# Patient Record
Sex: Male | Born: 1940 | Race: Black or African American | Hispanic: No
Health system: Southern US, Community
[De-identification: ages and names within clinical notes are randomized; demographics above are authoritative.]

## PROBLEM LIST (undated history)

## (undated) DIAGNOSIS — I10 Essential (primary) hypertension: Secondary | ICD-10-CM

---

## 1998-05-10 ENCOUNTER — Other Ambulatory Visit: Admission: RE | Admit: 1998-05-10 | Discharge: 1998-05-10 | Payer: Self-pay | Admitting: Urology

## 2004-01-01 ENCOUNTER — Ambulatory Visit (HOSPITAL_COMMUNITY): Admission: RE | Admit: 2004-01-01 | Discharge: 2004-01-01 | Payer: Self-pay | Admitting: Internal Medicine

## 2004-01-26 ENCOUNTER — Ambulatory Visit (HOSPITAL_COMMUNITY): Admission: RE | Admit: 2004-01-26 | Discharge: 2004-01-26 | Payer: Self-pay | Admitting: Internal Medicine

## 2004-11-24 ENCOUNTER — Ambulatory Visit: Payer: Self-pay | Admitting: Internal Medicine

## 2005-11-28 ENCOUNTER — Encounter (INDEPENDENT_AMBULATORY_CARE_PROVIDER_SITE_OTHER): Payer: Self-pay | Admitting: Specialist

## 2005-11-28 ENCOUNTER — Other Ambulatory Visit: Admission: RE | Admit: 2005-11-28 | Discharge: 2005-11-28 | Payer: Self-pay | Admitting: Urology

## 2007-05-01 ENCOUNTER — Ambulatory Visit (HOSPITAL_COMMUNITY): Admission: RE | Admit: 2007-05-01 | Discharge: 2007-05-01 | Payer: Self-pay | Admitting: Family Medicine

## 2007-05-03 ENCOUNTER — Ambulatory Visit (HOSPITAL_COMMUNITY): Admission: RE | Admit: 2007-05-03 | Discharge: 2007-05-03 | Payer: Self-pay | Admitting: Family Medicine

## 2007-06-07 ENCOUNTER — Ambulatory Visit (HOSPITAL_COMMUNITY): Admission: RE | Admit: 2007-06-07 | Discharge: 2007-06-07 | Payer: Self-pay | Admitting: Urology

## 2007-06-11 ENCOUNTER — Ambulatory Visit: Payer: Self-pay | Admitting: Thoracic Surgery

## 2007-07-29 ENCOUNTER — Inpatient Hospital Stay (HOSPITAL_COMMUNITY): Admission: RE | Admit: 2007-07-29 | Discharge: 2007-07-30 | Payer: Self-pay | Admitting: Surgery

## 2007-07-29 ENCOUNTER — Ambulatory Visit: Payer: Self-pay | Admitting: Thoracic Surgery

## 2007-07-29 ENCOUNTER — Encounter (INDEPENDENT_AMBULATORY_CARE_PROVIDER_SITE_OTHER): Payer: Self-pay | Admitting: Surgery

## 2008-01-29 ENCOUNTER — Ambulatory Visit: Payer: Self-pay | Admitting: Internal Medicine

## 2008-02-26 ENCOUNTER — Encounter: Payer: Self-pay | Admitting: Internal Medicine

## 2008-02-26 ENCOUNTER — Ambulatory Visit: Payer: Self-pay | Admitting: Internal Medicine

## 2008-02-26 ENCOUNTER — Ambulatory Visit (HOSPITAL_COMMUNITY): Admission: RE | Admit: 2008-02-26 | Discharge: 2008-02-26 | Payer: Self-pay | Admitting: Internal Medicine

## 2008-10-09 ENCOUNTER — Ambulatory Visit (HOSPITAL_COMMUNITY): Admission: RE | Admit: 2008-10-09 | Discharge: 2008-10-09 | Payer: Self-pay | Admitting: Family Medicine

## 2008-10-14 ENCOUNTER — Ambulatory Visit (HOSPITAL_COMMUNITY): Admission: RE | Admit: 2008-10-14 | Discharge: 2008-10-14 | Payer: Self-pay | Admitting: Family Medicine

## 2008-10-20 ENCOUNTER — Ambulatory Visit (HOSPITAL_COMMUNITY): Admission: RE | Admit: 2008-10-20 | Discharge: 2008-10-20 | Payer: Self-pay | Admitting: Family Medicine

## 2008-10-21 ENCOUNTER — Ambulatory Visit: Payer: Self-pay | Admitting: Thoracic Surgery

## 2008-10-27 ENCOUNTER — Ambulatory Visit: Admission: RE | Admit: 2008-10-27 | Discharge: 2008-10-27 | Payer: Self-pay | Admitting: Thoracic Surgery

## 2008-11-04 ENCOUNTER — Ambulatory Visit: Payer: Self-pay | Admitting: Cardiology

## 2008-11-05 ENCOUNTER — Ambulatory Visit: Payer: Self-pay | Admitting: Cardiology

## 2008-11-05 ENCOUNTER — Encounter: Payer: Self-pay | Admitting: Cardiology

## 2008-11-06 ENCOUNTER — Encounter: Payer: Self-pay | Admitting: Thoracic Surgery

## 2008-11-06 ENCOUNTER — Ambulatory Visit: Payer: Self-pay | Admitting: Thoracic Surgery

## 2008-11-06 ENCOUNTER — Ambulatory Visit: Payer: Self-pay | Admitting: Internal Medicine

## 2008-11-06 ENCOUNTER — Inpatient Hospital Stay (HOSPITAL_COMMUNITY): Admission: RE | Admit: 2008-11-06 | Discharge: 2008-11-16 | Payer: Self-pay | Admitting: Thoracic Surgery

## 2008-11-24 ENCOUNTER — Ambulatory Visit: Payer: Self-pay | Admitting: Thoracic Surgery

## 2008-11-24 ENCOUNTER — Encounter: Admission: RE | Admit: 2008-11-24 | Discharge: 2008-11-24 | Payer: Self-pay | Admitting: Thoracic Surgery

## 2008-11-25 ENCOUNTER — Ambulatory Visit: Payer: Self-pay | Admitting: Internal Medicine

## 2008-11-25 ENCOUNTER — Ambulatory Visit: Payer: Self-pay | Admitting: Thoracic Surgery

## 2008-11-25 ENCOUNTER — Encounter: Admission: RE | Admit: 2008-11-25 | Discharge: 2008-11-25 | Payer: Self-pay | Admitting: Thoracic Surgery

## 2008-11-26 ENCOUNTER — Ambulatory Visit: Payer: Self-pay | Admitting: Internal Medicine

## 2008-12-01 ENCOUNTER — Ambulatory Visit (HOSPITAL_COMMUNITY): Admission: RE | Admit: 2008-12-01 | Discharge: 2008-12-01 | Payer: Self-pay | Admitting: Internal Medicine

## 2008-12-01 LAB — CBC WITH DIFFERENTIAL/PLATELET
Basophils Absolute: 0 10*3/uL (ref 0.0–0.1)
EOS%: 2.3 % (ref 0.0–7.0)
Eosinophils Absolute: 0.1 10*3/uL (ref 0.0–0.5)
LYMPH%: 18.7 % (ref 14.0–49.0)
MCH: 29.6 pg (ref 27.2–33.4)
MCV: 88.9 fL (ref 79.3–98.0)
MONO%: 14.1 % — ABNORMAL HIGH (ref 0.0–14.0)
NEUT#: 3.9 10*3/uL (ref 1.5–6.5)
Platelets: 282 10*3/uL (ref 140–400)
RBC: 3.71 10*6/uL — ABNORMAL LOW (ref 4.20–5.82)
nRBC: 0 % (ref 0–0)

## 2008-12-01 LAB — COMPREHENSIVE METABOLIC PANEL
CO2: 30 mEq/L (ref 19–32)
Creatinine, Ser: 1.37 mg/dL (ref 0.40–1.50)
Glucose, Bld: 115 mg/dL — ABNORMAL HIGH (ref 70–99)
Total Bilirubin: 0.5 mg/dL (ref 0.3–1.2)

## 2008-12-10 LAB — CBC WITH DIFFERENTIAL/PLATELET
Basophils Absolute: 0 10*3/uL (ref 0.0–0.1)
Eosinophils Absolute: 0 10*3/uL (ref 0.0–0.5)
HCT: 34.4 % — ABNORMAL LOW (ref 38.4–49.9)
HGB: 11.7 g/dL — ABNORMAL LOW (ref 13.0–17.1)
MCH: 29.9 pg (ref 27.2–33.4)
MONO#: 1.2 10*3/uL — ABNORMAL HIGH (ref 0.1–0.9)
NEUT#: 15.9 10*3/uL — ABNORMAL HIGH (ref 1.5–6.5)
NEUT%: 88.5 % — ABNORMAL HIGH (ref 39.0–75.0)
WBC: 18 10*3/uL — ABNORMAL HIGH (ref 4.0–10.3)
lymph#: 0.9 10*3/uL (ref 0.9–3.3)

## 2008-12-10 LAB — COMPREHENSIVE METABOLIC PANEL
Albumin: 3.8 g/dL (ref 3.5–5.2)
BUN: 21 mg/dL (ref 6–23)
CO2: 26 mEq/L (ref 19–32)
Calcium: 9 mg/dL (ref 8.4–10.5)
Chloride: 103 mEq/L (ref 96–112)
Creatinine, Ser: 1.37 mg/dL (ref 0.40–1.50)
Potassium: 4.8 mEq/L (ref 3.5–5.3)

## 2008-12-17 LAB — COMPREHENSIVE METABOLIC PANEL
ALT: 11 U/L (ref 0–53)
AST: 10 U/L (ref 0–37)
CO2: 26 mEq/L (ref 19–32)
Chloride: 100 mEq/L (ref 96–112)
Creatinine, Ser: 1.58 mg/dL — ABNORMAL HIGH (ref 0.40–1.50)
Potassium: 4.8 mEq/L (ref 3.5–5.3)
Total Protein: 6.8 g/dL (ref 6.0–8.3)

## 2008-12-17 LAB — CBC WITH DIFFERENTIAL/PLATELET
BASO%: 0.3 % (ref 0.0–2.0)
Basophils Absolute: 0 10*3/uL (ref 0.0–0.1)
EOS%: 2.2 % (ref 0.0–7.0)
HCT: 36.7 % — ABNORMAL LOW (ref 38.4–49.9)
HGB: 12.2 g/dL — ABNORMAL LOW (ref 13.0–17.1)
MCHC: 33.1 g/dL (ref 32.0–36.0)
MONO#: 0.5 10*3/uL (ref 0.1–0.9)
NEUT%: 59.3 % (ref 39.0–75.0)
RDW: 13.6 % (ref 11.0–14.6)
WBC: 4.7 10*3/uL (ref 4.0–10.3)
lymph#: 1.3 10*3/uL (ref 0.9–3.3)

## 2008-12-23 ENCOUNTER — Encounter: Admission: RE | Admit: 2008-12-23 | Discharge: 2008-12-23 | Payer: Self-pay | Admitting: Thoracic Surgery

## 2008-12-23 ENCOUNTER — Ambulatory Visit: Payer: Self-pay | Admitting: Thoracic Surgery

## 2008-12-24 LAB — COMPREHENSIVE METABOLIC PANEL
AST: 12 U/L (ref 0–37)
Albumin: 3.9 g/dL (ref 3.5–5.2)
Alkaline Phosphatase: 73 U/L (ref 39–117)
BUN: 30 mg/dL — ABNORMAL HIGH (ref 6–23)
Potassium: 4.9 mEq/L (ref 3.5–5.3)
Sodium: 141 mEq/L (ref 135–145)
Total Bilirubin: 0.3 mg/dL (ref 0.3–1.2)

## 2008-12-24 LAB — CBC WITH DIFFERENTIAL/PLATELET
Basophils Absolute: 0 10*3/uL (ref 0.0–0.1)
Eosinophils Absolute: 0.1 10*3/uL (ref 0.0–0.5)
HGB: 10.4 g/dL — ABNORMAL LOW (ref 13.0–17.1)
LYMPH%: 22.9 % (ref 14.0–49.0)
MCV: 94.2 fL (ref 79.3–98.0)
MONO%: 12.7 % (ref 0.0–14.0)
NEUT#: 2.8 10*3/uL (ref 1.5–6.5)
Platelets: 169 10*3/uL (ref 140–400)

## 2008-12-29 ENCOUNTER — Ambulatory Visit: Payer: Self-pay | Admitting: Internal Medicine

## 2008-12-31 LAB — CBC WITH DIFFERENTIAL/PLATELET
Eosinophils Absolute: 0 10*3/uL (ref 0.0–0.5)
HCT: 31.2 % — ABNORMAL LOW (ref 38.4–49.9)
LYMPH%: 9.7 % — ABNORMAL LOW (ref 14.0–49.0)
MCHC: 34 g/dL (ref 32.0–36.0)
MCV: 88.4 fL (ref 79.3–98.0)
MONO#: 0.7 10*3/uL (ref 0.1–0.9)
MONO%: 12.3 % (ref 0.0–14.0)
NEUT#: 4.6 10*3/uL (ref 1.5–6.5)
NEUT%: 78 % — ABNORMAL HIGH (ref 39.0–75.0)
Platelets: 303 10*3/uL (ref 140–400)
RBC: 3.53 10*6/uL — ABNORMAL LOW (ref 4.20–5.82)
WBC: 5.9 10*3/uL (ref 4.0–10.3)
nRBC: 0 % (ref 0–0)

## 2008-12-31 LAB — COMPREHENSIVE METABOLIC PANEL
ALT: 15 U/L (ref 0–53)
Alkaline Phosphatase: 74 U/L (ref 39–117)
Creatinine, Ser: 1.39 mg/dL (ref 0.40–1.50)
Sodium: 137 mEq/L (ref 135–145)
Total Bilirubin: 0.3 mg/dL (ref 0.3–1.2)
Total Protein: 6.9 g/dL (ref 6.0–8.3)

## 2009-01-07 LAB — COMPREHENSIVE METABOLIC PANEL
ALT: 15 U/L (ref 0–53)
AST: 14 U/L (ref 0–37)
Alkaline Phosphatase: 71 U/L (ref 39–117)
BUN: 32 mg/dL — ABNORMAL HIGH (ref 6–23)
Calcium: 9 mg/dL (ref 8.4–10.5)
Chloride: 103 mEq/L (ref 96–112)
Creatinine, Ser: 1.57 mg/dL — ABNORMAL HIGH (ref 0.40–1.50)
Total Bilirubin: 0.6 mg/dL (ref 0.3–1.2)

## 2009-01-07 LAB — CBC WITH DIFFERENTIAL/PLATELET
BASO%: 0.4 % (ref 0.0–2.0)
Basophils Absolute: 0 10*3/uL (ref 0.0–0.1)
EOS%: 1.2 % (ref 0.0–7.0)
HCT: 32.5 % — ABNORMAL LOW (ref 38.4–49.9)
HGB: 10.9 g/dL — ABNORMAL LOW (ref 13.0–17.1)
LYMPH%: 44.8 % (ref 14.0–49.0)
MCH: 29.9 pg (ref 27.2–33.4)
MCHC: 33.5 g/dL (ref 32.0–36.0)
MCV: 89.3 fL (ref 79.3–98.0)
MONO%: 7.1 % (ref 0.0–14.0)
NEUT%: 46.5 % (ref 39.0–75.0)

## 2009-01-14 LAB — CBC WITH DIFFERENTIAL/PLATELET
Basophils Absolute: 0 10*3/uL (ref 0.0–0.1)
EOS%: 2 % (ref 0.0–7.0)
HCT: 28 % — ABNORMAL LOW (ref 38.4–49.9)
HGB: 9.3 g/dL — ABNORMAL LOW (ref 13.0–17.1)
LYMPH%: 31.2 % (ref 14.0–49.0)
MCH: 29.7 pg (ref 27.2–33.4)
NEUT%: 51.6 % (ref 39.0–75.0)
Platelets: 73 10*3/uL — ABNORMAL LOW (ref 140–400)
lymph#: 1.2 10*3/uL (ref 0.9–3.3)

## 2009-01-14 LAB — COMPREHENSIVE METABOLIC PANEL
AST: 12 U/L (ref 0–37)
BUN: 20 mg/dL (ref 6–23)
CO2: 24 mEq/L (ref 19–32)
Calcium: 8.3 mg/dL — ABNORMAL LOW (ref 8.4–10.5)
Chloride: 103 mEq/L (ref 96–112)
Creatinine, Ser: 1.35 mg/dL (ref 0.40–1.50)
Total Bilirubin: 0.3 mg/dL (ref 0.3–1.2)

## 2009-01-21 LAB — COMPREHENSIVE METABOLIC PANEL
AST: 14 U/L (ref 0–37)
Albumin: 4.2 g/dL (ref 3.5–5.2)
Alkaline Phosphatase: 75 U/L (ref 39–117)
BUN: 23 mg/dL (ref 6–23)
Calcium: 9.5 mg/dL (ref 8.4–10.5)
Chloride: 101 mEq/L (ref 96–112)
Potassium: 4.6 mEq/L (ref 3.5–5.3)
Sodium: 138 mEq/L (ref 135–145)
Total Protein: 6.9 g/dL (ref 6.0–8.3)

## 2009-01-21 LAB — CBC WITH DIFFERENTIAL/PLATELET
Basophils Absolute: 0 10*3/uL (ref 0.0–0.1)
EOS%: 0 % (ref 0.0–7.0)
Eosinophils Absolute: 0 10*3/uL (ref 0.0–0.5)
HGB: 9.1 g/dL — ABNORMAL LOW (ref 13.0–17.1)
MCH: 29.8 pg (ref 27.2–33.4)
MCV: 88.5 fL (ref 79.3–98.0)
MONO%: 18.6 % — ABNORMAL HIGH (ref 0.0–14.0)
NEUT#: 3.6 10*3/uL (ref 1.5–6.5)
RBC: 3.05 10*6/uL — ABNORMAL LOW (ref 4.20–5.82)
RDW: 14.5 % (ref 11.0–14.6)
lymph#: 1.2 10*3/uL (ref 0.9–3.3)

## 2009-01-28 ENCOUNTER — Ambulatory Visit: Payer: Self-pay | Admitting: Internal Medicine

## 2009-01-28 LAB — CBC WITH DIFFERENTIAL/PLATELET
BASO%: 0.4 % (ref 0.0–2.0)
EOS%: 0.8 % (ref 0.0–7.0)
MCH: 29.9 pg (ref 27.2–33.4)
MCHC: 33.3 g/dL (ref 32.0–36.0)
MCV: 89.8 fL (ref 79.3–98.0)
MONO%: 6.7 % (ref 0.0–14.0)
RBC: 3.34 10*6/uL — ABNORMAL LOW (ref 4.20–5.82)
RDW: 14.6 % (ref 11.0–14.6)
lymph#: 1.4 10*3/uL (ref 0.9–3.3)
nRBC: 0 % (ref 0–0)

## 2009-01-28 LAB — COMPREHENSIVE METABOLIC PANEL
AST: 14 U/L (ref 0–37)
Alkaline Phosphatase: 65 U/L (ref 39–117)
Glucose, Bld: 107 mg/dL — ABNORMAL HIGH (ref 70–99)
Potassium: 4.5 mEq/L (ref 3.5–5.3)
Sodium: 138 mEq/L (ref 135–145)
Total Bilirubin: 0.5 mg/dL (ref 0.3–1.2)
Total Protein: 6.4 g/dL (ref 6.0–8.3)

## 2009-02-03 ENCOUNTER — Ambulatory Visit: Payer: Self-pay | Admitting: Thoracic Surgery

## 2009-02-03 ENCOUNTER — Encounter: Admission: RE | Admit: 2009-02-03 | Discharge: 2009-02-03 | Payer: Self-pay | Admitting: Thoracic Surgery

## 2009-02-04 LAB — CBC WITH DIFFERENTIAL/PLATELET
Eosinophils Absolute: 0 10*3/uL (ref 0.0–0.5)
LYMPH%: 36.3 % (ref 14.0–49.0)
MCH: 30 pg (ref 27.2–33.4)
MCHC: 33.1 g/dL (ref 32.0–36.0)
MCV: 90.6 fL (ref 79.3–98.0)
MONO%: 18.2 % — ABNORMAL HIGH (ref 0.0–14.0)
NEUT#: 1.8 10*3/uL (ref 1.5–6.5)
Platelets: 78 10*3/uL — ABNORMAL LOW (ref 140–400)
RBC: 2.87 10*6/uL — ABNORMAL LOW (ref 4.20–5.82)

## 2009-02-04 LAB — COMPREHENSIVE METABOLIC PANEL
Alkaline Phosphatase: 68 U/L (ref 39–117)
BUN: 21 mg/dL (ref 6–23)
Glucose, Bld: 83 mg/dL (ref 70–99)
Sodium: 140 mEq/L (ref 135–145)
Total Bilirubin: 0.4 mg/dL (ref 0.3–1.2)
Total Protein: 6.4 g/dL (ref 6.0–8.3)

## 2009-02-08 ENCOUNTER — Emergency Department (HOSPITAL_COMMUNITY): Admission: EM | Admit: 2009-02-08 | Discharge: 2009-02-08 | Payer: Self-pay | Admitting: Emergency Medicine

## 2009-02-11 LAB — CBC WITH DIFFERENTIAL/PLATELET
Basophils Absolute: 0 10*3/uL (ref 0.0–0.1)
Eosinophils Absolute: 0 10*3/uL (ref 0.0–0.5)
HGB: 8.6 g/dL — ABNORMAL LOW (ref 13.0–17.1)
LYMPH%: 11.1 % — ABNORMAL LOW (ref 14.0–49.0)
MCV: 90.6 fL (ref 79.3–98.0)
MONO%: 29.6 % — ABNORMAL HIGH (ref 0.0–14.0)
NEUT#: 4 10*3/uL (ref 1.5–6.5)
Platelets: 203 10*3/uL (ref 140–400)

## 2009-02-11 LAB — COMPREHENSIVE METABOLIC PANEL
CO2: 24 mEq/L (ref 19–32)
Creatinine, Ser: 1.34 mg/dL (ref 0.40–1.50)
Glucose, Bld: 127 mg/dL — ABNORMAL HIGH (ref 70–99)
Total Bilirubin: 0.4 mg/dL (ref 0.3–1.2)

## 2009-02-18 LAB — CBC WITH DIFFERENTIAL/PLATELET
Eosinophils Absolute: 0 10*3/uL (ref 0.0–0.5)
HCT: 27.1 % — ABNORMAL LOW (ref 38.4–49.9)
LYMPH%: 47.3 % (ref 14.0–49.0)
MONO#: 0.2 10*3/uL (ref 0.1–0.9)
NEUT#: 1.3 10*3/uL — ABNORMAL LOW (ref 1.5–6.5)
NEUT%: 42.9 % (ref 39.0–75.0)
Platelets: 249 10*3/uL (ref 140–400)
RBC: 2.87 10*6/uL — ABNORMAL LOW (ref 4.20–5.82)
WBC: 3 10*3/uL — ABNORMAL LOW (ref 4.0–10.3)

## 2009-02-18 LAB — COMPREHENSIVE METABOLIC PANEL
CO2: 27 mEq/L (ref 19–32)
Calcium: 9 mg/dL (ref 8.4–10.5)
Chloride: 102 mEq/L (ref 96–112)
Glucose, Bld: 111 mg/dL — ABNORMAL HIGH (ref 70–99)
Sodium: 138 mEq/L (ref 135–145)
Total Bilirubin: 0.6 mg/dL (ref 0.3–1.2)
Total Protein: 6.7 g/dL (ref 6.0–8.3)

## 2009-02-25 LAB — COMPREHENSIVE METABOLIC PANEL
ALT: 9 U/L (ref 0–53)
Albumin: 3.8 g/dL (ref 3.5–5.2)
CO2: 26 mEq/L (ref 19–32)
Chloride: 106 mEq/L (ref 96–112)
Glucose, Bld: 90 mg/dL (ref 70–99)
Potassium: 4.5 mEq/L (ref 3.5–5.3)
Sodium: 142 mEq/L (ref 135–145)
Total Protein: 6.3 g/dL (ref 6.0–8.3)

## 2009-02-25 LAB — CBC WITH DIFFERENTIAL/PLATELET
BASO%: 0.4 % (ref 0.0–2.0)
Eosinophils Absolute: 0.1 10*3/uL (ref 0.0–0.5)
MONO#: 0.7 10*3/uL (ref 0.1–0.9)
NEUT#: 1.4 10*3/uL — ABNORMAL LOW (ref 1.5–6.5)
Platelets: 80 10*3/uL — ABNORMAL LOW (ref 140–400)
RBC: 2.28 10*6/uL — ABNORMAL LOW (ref 4.20–5.82)
RDW: 19.6 % — ABNORMAL HIGH (ref 11.0–14.6)
WBC: 4 10*3/uL (ref 4.0–10.3)
lymph#: 1.7 10*3/uL (ref 0.9–3.3)

## 2009-02-26 ENCOUNTER — Encounter (HOSPITAL_COMMUNITY): Admission: RE | Admit: 2009-02-26 | Discharge: 2009-04-29 | Payer: Self-pay | Admitting: Internal Medicine

## 2009-02-26 LAB — TYPE & CROSSMATCH - CHCC

## 2009-03-01 ENCOUNTER — Ambulatory Visit (HOSPITAL_COMMUNITY): Admission: RE | Admit: 2009-03-01 | Discharge: 2009-03-01 | Payer: Self-pay | Admitting: Internal Medicine

## 2009-03-01 ENCOUNTER — Ambulatory Visit: Payer: Self-pay | Admitting: Internal Medicine

## 2009-03-03 LAB — COMPREHENSIVE METABOLIC PANEL
ALT: 14 U/L (ref 0–53)
AST: 17 U/L (ref 0–37)
Alkaline Phosphatase: 70 U/L (ref 39–117)
Sodium: 138 mEq/L (ref 135–145)
Total Bilirubin: 0.4 mg/dL (ref 0.3–1.2)
Total Protein: 6.6 g/dL (ref 6.0–8.3)

## 2009-03-03 LAB — CBC WITH DIFFERENTIAL/PLATELET
BASO%: 0 % (ref 0.0–2.0)
EOS%: 2.7 % (ref 0.0–7.0)
MCH: 30.7 pg (ref 27.2–33.4)
MCV: 93.3 fL (ref 79.3–98.0)
MONO%: 20.3 % — ABNORMAL HIGH (ref 0.0–14.0)
RBC: 3.29 10*6/uL — ABNORMAL LOW (ref 4.20–5.82)
RDW: 17.7 % — ABNORMAL HIGH (ref 11.0–14.6)
lymph#: 1.8 10*3/uL (ref 0.9–3.3)

## 2009-03-03 LAB — TECHNOLOGIST REVIEW

## 2009-05-05 ENCOUNTER — Ambulatory Visit: Payer: Self-pay | Admitting: Thoracic Surgery

## 2009-05-05 ENCOUNTER — Encounter: Admission: RE | Admit: 2009-05-05 | Discharge: 2009-05-05 | Payer: Self-pay | Admitting: Thoracic Surgery

## 2009-05-27 ENCOUNTER — Ambulatory Visit: Payer: Self-pay | Admitting: Internal Medicine

## 2009-05-31 ENCOUNTER — Ambulatory Visit (HOSPITAL_COMMUNITY): Admission: RE | Admit: 2009-05-31 | Discharge: 2009-05-31 | Payer: Self-pay | Admitting: Internal Medicine

## 2009-05-31 LAB — CBC WITH DIFFERENTIAL/PLATELET
Basophils Absolute: 0 10*3/uL (ref 0.0–0.1)
Eosinophils Absolute: 0.1 10*3/uL (ref 0.0–0.5)
HGB: 11.8 g/dL — ABNORMAL LOW (ref 13.0–17.1)
LYMPH%: 38.3 % (ref 14.0–49.0)
MCV: 93.6 fL (ref 79.3–98.0)
MONO#: 0.7 10*3/uL (ref 0.1–0.9)
MONO%: 13.5 % (ref 0.0–14.0)
NEUT#: 2.4 10*3/uL (ref 1.5–6.5)
Platelets: 212 10*3/uL (ref 140–400)
RDW: 12.7 % (ref 11.0–14.6)
WBC: 5.2 10*3/uL (ref 4.0–10.3)

## 2009-05-31 LAB — COMPREHENSIVE METABOLIC PANEL
ALT: 12 U/L (ref 0–53)
CO2: 29 mEq/L (ref 19–32)
Calcium: 8.9 mg/dL (ref 8.4–10.5)
Chloride: 104 mEq/L (ref 96–112)
Glucose, Bld: 96 mg/dL (ref 70–99)
Sodium: 138 mEq/L (ref 135–145)
Total Bilirubin: 0.5 mg/dL (ref 0.3–1.2)
Total Protein: 7 g/dL (ref 6.0–8.3)

## 2009-08-25 ENCOUNTER — Ambulatory Visit: Payer: Self-pay | Admitting: Internal Medicine

## 2009-08-26 ENCOUNTER — Ambulatory Visit (HOSPITAL_COMMUNITY): Admission: RE | Admit: 2009-08-26 | Discharge: 2009-08-26 | Payer: Self-pay | Admitting: Internal Medicine

## 2009-08-26 LAB — COMPREHENSIVE METABOLIC PANEL
Alkaline Phosphatase: 80 U/L (ref 39–117)
BUN: 27 mg/dL — ABNORMAL HIGH (ref 6–23)
CO2: 29 mEq/L (ref 19–32)
Creatinine, Ser: 1.87 mg/dL — ABNORMAL HIGH (ref 0.40–1.50)
Glucose, Bld: 97 mg/dL (ref 70–99)
Total Bilirubin: 0.6 mg/dL (ref 0.3–1.2)
Total Protein: 7 g/dL (ref 6.0–8.3)

## 2009-08-26 LAB — CBC WITH DIFFERENTIAL/PLATELET
Basophils Absolute: 0 10*3/uL (ref 0.0–0.1)
Eosinophils Absolute: 0.1 10*3/uL (ref 0.0–0.5)
HCT: 37.9 % — ABNORMAL LOW (ref 38.4–49.9)
HGB: 12.4 g/dL — ABNORMAL LOW (ref 13.0–17.1)
LYMPH%: 25.7 % (ref 14.0–49.0)
MCV: 92.4 fL (ref 79.3–98.0)
MONO#: 0.6 10*3/uL (ref 0.1–0.9)
MONO%: 12.6 % (ref 0.0–14.0)
NEUT#: 3.1 10*3/uL (ref 1.5–6.5)
NEUT%: 59.6 % (ref 39.0–75.0)
Platelets: 235 10*3/uL (ref 140–400)
RBC: 4.11 10*6/uL — ABNORMAL LOW (ref 4.20–5.82)
WBC: 5.1 10*3/uL (ref 4.0–10.3)

## 2009-10-06 ENCOUNTER — Ambulatory Visit (HOSPITAL_COMMUNITY): Admission: RE | Admit: 2009-10-06 | Discharge: 2009-10-06 | Payer: Self-pay | Admitting: Family Medicine

## 2009-10-08 ENCOUNTER — Ambulatory Visit: Admission: RE | Admit: 2009-10-08 | Discharge: 2009-11-08 | Payer: Self-pay | Admitting: Radiation Oncology

## 2009-10-11 ENCOUNTER — Ambulatory Visit: Payer: Self-pay | Admitting: Internal Medicine

## 2009-10-11 LAB — COMPREHENSIVE METABOLIC PANEL
ALT: 10 U/L (ref 0–53)
AST: 12 U/L (ref 0–37)
Albumin: 4.2 g/dL (ref 3.5–5.2)
Alkaline Phosphatase: 71 U/L (ref 39–117)
BUN: 28 mg/dL — ABNORMAL HIGH (ref 6–23)
Calcium: 9.6 mg/dL (ref 8.4–10.5)
Chloride: 103 mEq/L (ref 96–112)
Creatinine, Ser: 1.59 mg/dL — ABNORMAL HIGH (ref 0.40–1.50)
Potassium: 4.4 mEq/L (ref 3.5–5.3)

## 2009-10-11 LAB — CBC WITH DIFFERENTIAL/PLATELET
BASO%: 0.2 % (ref 0.0–2.0)
EOS%: 1.8 % (ref 0.0–7.0)
MCH: 29.7 pg (ref 27.2–33.4)
MCHC: 33.1 g/dL (ref 32.0–36.0)
MONO#: 0.6 10*3/uL (ref 0.1–0.9)
NEUT%: 53.1 % (ref 39.0–75.0)
RBC: 4.14 10*6/uL — ABNORMAL LOW (ref 4.20–5.82)
WBC: 5.1 10*3/uL (ref 4.0–10.3)
lymph#: 1.7 10*3/uL (ref 0.9–3.3)
nRBC: 0 % (ref 0–0)

## 2009-10-15 ENCOUNTER — Ambulatory Visit (HOSPITAL_COMMUNITY): Admission: RE | Admit: 2009-10-15 | Discharge: 2009-10-15 | Payer: Self-pay | Admitting: Radiation Oncology

## 2009-10-18 ENCOUNTER — Ambulatory Visit (HOSPITAL_COMMUNITY): Admission: RE | Admit: 2009-10-18 | Discharge: 2009-10-18 | Payer: Self-pay | Admitting: Radiation Oncology

## 2009-11-23 ENCOUNTER — Ambulatory Visit: Payer: Self-pay | Admitting: Internal Medicine

## 2009-11-25 ENCOUNTER — Ambulatory Visit (HOSPITAL_COMMUNITY): Admission: RE | Admit: 2009-11-25 | Discharge: 2009-11-25 | Payer: Self-pay | Admitting: Internal Medicine

## 2009-11-25 LAB — COMPREHENSIVE METABOLIC PANEL
ALT: 13 U/L (ref 0–53)
AST: 15 U/L (ref 0–37)
Albumin: 3.7 g/dL (ref 3.5–5.2)
CO2: 31 mEq/L (ref 19–32)
Calcium: 9.4 mg/dL (ref 8.4–10.5)
Chloride: 105 mEq/L (ref 96–112)
Creatinine, Ser: 1.45 mg/dL (ref 0.40–1.50)
Potassium: 4.2 mEq/L (ref 3.5–5.3)
Sodium: 140 mEq/L (ref 135–145)
Total Protein: 7 g/dL (ref 6.0–8.3)

## 2009-11-25 LAB — CBC WITH DIFFERENTIAL/PLATELET
BASO%: 0.4 % (ref 0.0–2.0)
EOS%: 2.1 % (ref 0.0–7.0)
HCT: 38.5 % (ref 38.4–49.9)
MCHC: 32.4 g/dL (ref 32.0–36.0)
MONO#: 0.6 10*3/uL (ref 0.1–0.9)
NEUT%: 62.3 % (ref 39.0–75.0)
RDW: 14 % (ref 11.0–14.6)
WBC: 4 10*3/uL (ref 4.0–10.3)
lymph#: 0.8 10*3/uL — ABNORMAL LOW (ref 0.9–3.3)

## 2009-12-08 LAB — CBC WITH DIFFERENTIAL/PLATELET
Basophils Absolute: 0 10*3/uL (ref 0.0–0.1)
EOS%: 0 % (ref 0.0–7.0)
HCT: 38.4 % (ref 38.4–49.9)
HGB: 12.9 g/dL — ABNORMAL LOW (ref 13.0–17.1)
MCH: 30 pg (ref 27.2–33.4)
MCV: 89.3 fL (ref 79.3–98.0)
MONO%: 2.6 % (ref 0.0–14.0)
NEUT%: 93.8 % — ABNORMAL HIGH (ref 39.0–75.0)
Platelets: 204 10*3/uL (ref 140–400)
lymph#: 0.5 10*3/uL — ABNORMAL LOW (ref 0.9–3.3)

## 2009-12-08 LAB — COMPREHENSIVE METABOLIC PANEL
AST: 14 U/L (ref 0–37)
BUN: 25 mg/dL — ABNORMAL HIGH (ref 6–23)
Calcium: 10.2 mg/dL (ref 8.4–10.5)
Chloride: 100 mEq/L (ref 96–112)
Creatinine, Ser: 1.4 mg/dL (ref 0.40–1.50)

## 2009-12-15 LAB — COMPREHENSIVE METABOLIC PANEL
BUN: 32 mg/dL — ABNORMAL HIGH (ref 6–23)
CO2: 29 mEq/L (ref 19–32)
Calcium: 8.9 mg/dL (ref 8.4–10.5)
Chloride: 101 mEq/L (ref 96–112)
Creatinine, Ser: 1.46 mg/dL (ref 0.40–1.50)
Glucose, Bld: 144 mg/dL — ABNORMAL HIGH (ref 70–99)

## 2009-12-15 LAB — CBC WITH DIFFERENTIAL/PLATELET
Basophils Absolute: 0 10*3/uL (ref 0.0–0.1)
Eosinophils Absolute: 0 10*3/uL (ref 0.0–0.5)
HCT: 37 % — ABNORMAL LOW (ref 38.4–49.9)
HGB: 12.4 g/dL — ABNORMAL LOW (ref 13.0–17.1)
MCH: 31.1 pg (ref 27.2–33.4)
MONO#: 0.3 10*3/uL (ref 0.1–0.9)
NEUT%: 88 % — ABNORMAL HIGH (ref 39.0–75.0)
lymph#: 0.7 10*3/uL — ABNORMAL LOW (ref 0.9–3.3)

## 2009-12-22 LAB — CBC WITH DIFFERENTIAL/PLATELET
BASO%: 0 % (ref 0.0–2.0)
Basophils Absolute: 0 10*3/uL (ref 0.0–0.1)
HCT: 37.8 % — ABNORMAL LOW (ref 38.4–49.9)
HGB: 12.1 g/dL — ABNORMAL LOW (ref 13.0–17.1)
MONO#: 0.7 10*3/uL (ref 0.1–0.9)
NEUT%: 89.6 % — ABNORMAL HIGH (ref 39.0–75.0)
WBC: 15.8 10*3/uL — ABNORMAL HIGH (ref 4.0–10.3)
lymph#: 0.9 10*3/uL (ref 0.9–3.3)

## 2009-12-22 LAB — COMPREHENSIVE METABOLIC PANEL
ALT: 9 U/L (ref 0–53)
Albumin: 3.9 g/dL (ref 3.5–5.2)
BUN: 22 mg/dL (ref 6–23)
CO2: 28 mEq/L (ref 19–32)
Calcium: 9 mg/dL (ref 8.4–10.5)
Chloride: 103 mEq/L (ref 96–112)
Creatinine, Ser: 1.36 mg/dL (ref 0.40–1.50)
Potassium: 4.4 mEq/L (ref 3.5–5.3)

## 2009-12-28 ENCOUNTER — Ambulatory Visit: Payer: Self-pay | Admitting: Internal Medicine

## 2009-12-30 LAB — CBC WITH DIFFERENTIAL/PLATELET
BASO%: 0.1 % (ref 0.0–2.0)
Basophils Absolute: 0 10*3/uL (ref 0.0–0.1)
EOS%: 0 % (ref 0.0–7.0)
Eosinophils Absolute: 0 10*3/uL (ref 0.0–0.5)
HCT: 37.2 % — ABNORMAL LOW (ref 38.4–49.9)
HGB: 12.1 g/dL — ABNORMAL LOW (ref 13.0–17.1)
MCH: 29.5 pg (ref 27.2–33.4)
MCV: 90.7 fL (ref 79.3–98.0)
NEUT#: 19.3 10*3/uL — ABNORMAL HIGH (ref 1.5–6.5)
Platelets: 378 10*3/uL (ref 140–400)
RDW: 14 % (ref 11.0–14.6)
nRBC: 0 % (ref 0–0)

## 2009-12-30 LAB — COMPREHENSIVE METABOLIC PANEL
AST: 13 U/L (ref 0–37)
Alkaline Phosphatase: 80 U/L (ref 39–117)
BUN: 23 mg/dL (ref 6–23)
Glucose, Bld: 132 mg/dL — ABNORMAL HIGH (ref 70–99)
Total Bilirubin: 0.3 mg/dL (ref 0.3–1.2)

## 2010-01-06 LAB — COMPREHENSIVE METABOLIC PANEL
ALT: 8 U/L (ref 0–53)
AST: 9 U/L (ref 0–37)
Albumin: 3.7 g/dL (ref 3.5–5.2)
Alkaline Phosphatase: 79 U/L (ref 39–117)
BUN: 24 mg/dL — ABNORMAL HIGH (ref 6–23)
Calcium: 9.2 mg/dL (ref 8.4–10.5)
Chloride: 102 mEq/L (ref 96–112)
Potassium: 4.3 mEq/L (ref 3.5–5.3)
Sodium: 141 mEq/L (ref 135–145)
Total Protein: 5.7 g/dL — ABNORMAL LOW (ref 6.0–8.3)

## 2010-01-06 LAB — CBC WITH DIFFERENTIAL/PLATELET
BASO%: 0.1 % (ref 0.0–2.0)
Basophils Absolute: 0 10*3/uL (ref 0.0–0.1)
EOS%: 0.3 % (ref 0.0–7.0)
HGB: 11.5 g/dL — ABNORMAL LOW (ref 13.0–17.1)
MCH: 30.7 pg (ref 27.2–33.4)
MCHC: 33.3 g/dL (ref 32.0–36.0)
MCV: 92.1 fL (ref 79.3–98.0)
MONO%: 4.8 % (ref 0.0–14.0)
RBC: 3.74 10*6/uL — ABNORMAL LOW (ref 4.20–5.82)
RDW: 14.4 % (ref 11.0–14.6)
lymph#: 1.1 10*3/uL (ref 0.9–3.3)

## 2010-01-13 LAB — CBC WITH DIFFERENTIAL/PLATELET
BASO%: 0 % (ref 0.0–2.0)
Basophils Absolute: 0 10*3/uL (ref 0.0–0.1)
EOS%: 0.4 % (ref 0.0–7.0)
HGB: 11.2 g/dL — ABNORMAL LOW (ref 13.0–17.1)
MCH: 29.5 pg (ref 27.2–33.4)
MCHC: 32 g/dL (ref 32.0–36.0)
MCV: 92.2 fL (ref 79.3–98.0)
MONO%: 6.9 % (ref 0.0–14.0)
NEUT%: 85.5 % — ABNORMAL HIGH (ref 39.0–75.0)
RDW: 15.1 % — ABNORMAL HIGH (ref 11.0–14.6)
lymph#: 0.9 10*3/uL (ref 0.9–3.3)

## 2010-01-13 LAB — COMPREHENSIVE METABOLIC PANEL
ALT: 8 U/L (ref 0–53)
AST: 11 U/L (ref 0–37)
Alkaline Phosphatase: 82 U/L (ref 39–117)
BUN: 21 mg/dL (ref 6–23)
Creatinine, Ser: 1.46 mg/dL (ref 0.40–1.50)
Potassium: 4.6 mEq/L (ref 3.5–5.3)

## 2010-01-19 LAB — CBC WITH DIFFERENTIAL/PLATELET
Basophils Absolute: 0 10*3/uL (ref 0.0–0.1)
EOS%: 0 % (ref 0.0–7.0)
HCT: 33.1 % — ABNORMAL LOW (ref 38.4–49.9)
HGB: 10.9 g/dL — ABNORMAL LOW (ref 13.0–17.1)
LYMPH%: 2.5 % — ABNORMAL LOW (ref 14.0–49.0)
MCH: 29.5 pg (ref 27.2–33.4)
MCHC: 32.9 g/dL (ref 32.0–36.0)
MCV: 89.5 fL (ref 79.3–98.0)
MONO%: 8.4 % (ref 0.0–14.0)
NEUT%: 89.1 % — ABNORMAL HIGH (ref 39.0–75.0)

## 2010-01-19 LAB — COMPREHENSIVE METABOLIC PANEL
AST: 11 U/L (ref 0–37)
Alkaline Phosphatase: 76 U/L (ref 39–117)
BUN: 26 mg/dL — ABNORMAL HIGH (ref 6–23)
Calcium: 10 mg/dL (ref 8.4–10.5)
Creatinine, Ser: 1.33 mg/dL (ref 0.40–1.50)
Total Bilirubin: 0.4 mg/dL (ref 0.3–1.2)

## 2010-01-26 LAB — COMPREHENSIVE METABOLIC PANEL
AST: 13 U/L (ref 0–37)
Alkaline Phosphatase: 95 U/L (ref 39–117)
BUN: 25 mg/dL — ABNORMAL HIGH (ref 6–23)
Creatinine, Ser: 1.44 mg/dL (ref 0.40–1.50)
Total Bilirubin: 0.3 mg/dL (ref 0.3–1.2)

## 2010-01-26 LAB — CBC WITH DIFFERENTIAL/PLATELET
Basophils Absolute: 0 10*3/uL (ref 0.0–0.1)
EOS%: 0.3 % (ref 0.0–7.0)
HCT: 33 % — ABNORMAL LOW (ref 38.4–49.9)
HGB: 11.1 g/dL — ABNORMAL LOW (ref 13.0–17.1)
LYMPH%: 6 % — ABNORMAL LOW (ref 14.0–49.0)
MCH: 31 pg (ref 27.2–33.4)
MCV: 92.6 fL (ref 79.3–98.0)
NEUT%: 82.3 % — ABNORMAL HIGH (ref 39.0–75.0)
Platelets: 231 10*3/uL (ref 140–400)
lymph#: 0.8 10*3/uL — ABNORMAL LOW (ref 0.9–3.3)

## 2010-01-31 ENCOUNTER — Ambulatory Visit: Payer: Self-pay | Admitting: Internal Medicine

## 2010-02-02 ENCOUNTER — Ambulatory Visit (HOSPITAL_COMMUNITY): Admission: RE | Admit: 2010-02-02 | Discharge: 2010-02-02 | Payer: Self-pay | Admitting: Internal Medicine

## 2010-02-02 LAB — CBC WITH DIFFERENTIAL/PLATELET
BASO%: 0.3 % (ref 0.0–2.0)
Eosinophils Absolute: 0 10*3/uL (ref 0.0–0.5)
MCHC: 33.4 g/dL (ref 32.0–36.0)
MONO#: 0.7 10*3/uL (ref 0.1–0.9)
NEUT#: 12.4 10*3/uL — ABNORMAL HIGH (ref 1.5–6.5)
Platelets: 254 10*3/uL (ref 140–400)
RBC: 3.44 10*6/uL — ABNORMAL LOW (ref 4.20–5.82)
RDW: 16.1 % — ABNORMAL HIGH (ref 11.0–14.6)
WBC: 14.2 10*3/uL — ABNORMAL HIGH (ref 4.0–10.3)
lymph#: 1.1 10*3/uL (ref 0.9–3.3)

## 2010-02-02 LAB — COMPREHENSIVE METABOLIC PANEL
ALT: 11 U/L (ref 0–53)
Albumin: 3.5 g/dL (ref 3.5–5.2)
CO2: 31 mEq/L (ref 19–32)
Chloride: 102 mEq/L (ref 96–112)
Glucose, Bld: 80 mg/dL (ref 70–99)
Potassium: 4.5 mEq/L (ref 3.5–5.3)
Sodium: 140 mEq/L (ref 135–145)
Total Bilirubin: 0.7 mg/dL (ref 0.3–1.2)
Total Protein: 6.6 g/dL (ref 6.0–8.3)

## 2010-02-09 LAB — CBC WITH DIFFERENTIAL/PLATELET
BASO%: 0.1 % (ref 0.0–2.0)
Eosinophils Absolute: 0 10*3/uL (ref 0.0–0.5)
LYMPH%: 3 % — ABNORMAL LOW (ref 14.0–49.0)
MCHC: 32.8 g/dL (ref 32.0–36.0)
MONO#: 0.3 10*3/uL (ref 0.1–0.9)
MONO%: 1.7 % (ref 0.0–14.0)
NEUT#: 18.4 10*3/uL — ABNORMAL HIGH (ref 1.5–6.5)
Platelets: 305 10*3/uL (ref 140–400)
RBC: 3.45 10*6/uL — ABNORMAL LOW (ref 4.20–5.82)
RDW: 16.8 % — ABNORMAL HIGH (ref 11.0–14.6)
WBC: 19.4 10*3/uL — ABNORMAL HIGH (ref 4.0–10.3)

## 2010-02-09 LAB — COMPREHENSIVE METABOLIC PANEL
ALT: 9 U/L (ref 0–53)
Albumin: 4.2 g/dL (ref 3.5–5.2)
Alkaline Phosphatase: 76 U/L (ref 39–117)
CO2: 24 mEq/L (ref 19–32)
Glucose, Bld: 107 mg/dL — ABNORMAL HIGH (ref 70–99)
Potassium: 4.4 mEq/L (ref 3.5–5.3)
Sodium: 138 mEq/L (ref 135–145)
Total Bilirubin: 0.4 mg/dL (ref 0.3–1.2)
Total Protein: 6.9 g/dL (ref 6.0–8.3)

## 2010-03-02 ENCOUNTER — Other Ambulatory Visit: Payer: Self-pay | Admitting: Internal Medicine

## 2010-03-02 LAB — CBC WITH DIFFERENTIAL/PLATELET
Basophils Absolute: 0 10*3/uL (ref 0.0–0.1)
Eosinophils Absolute: 0.1 10*3/uL (ref 0.0–0.5)
HGB: 10 g/dL — ABNORMAL LOW (ref 13.0–17.1)
MONO#: 0.6 10*3/uL (ref 0.1–0.9)
NEUT#: 2.5 10*3/uL (ref 1.5–6.5)
RBC: 3.25 10*6/uL — ABNORMAL LOW (ref 4.20–5.82)
RDW: 16.5 % — ABNORMAL HIGH (ref 11.0–14.6)
WBC: 4.3 10*3/uL (ref 4.0–10.3)
lymph#: 1.1 10*3/uL (ref 0.9–3.3)
nRBC: 0 % (ref 0–0)

## 2010-03-02 LAB — COMPREHENSIVE METABOLIC PANEL
Albumin: 4.4 g/dL (ref 3.5–5.2)
Alkaline Phosphatase: 68 U/L (ref 39–117)
BUN: 17 mg/dL (ref 6–23)
CO2: 26 mEq/L (ref 19–32)
Glucose, Bld: 98 mg/dL (ref 70–99)
Potassium: 4.5 mEq/L (ref 3.5–5.3)
Sodium: 139 mEq/L (ref 135–145)
Total Bilirubin: 0.6 mg/dL (ref 0.3–1.2)
Total Protein: 6.7 g/dL (ref 6.0–8.3)

## 2010-03-09 ENCOUNTER — Other Ambulatory Visit: Payer: Self-pay | Admitting: Internal Medicine

## 2010-03-09 LAB — CBC WITH DIFFERENTIAL/PLATELET
Basophils Absolute: 0.1 10*3/uL (ref 0.0–0.1)
Eosinophils Absolute: 0 10*3/uL (ref 0.0–0.5)
HGB: 10.6 g/dL — ABNORMAL LOW (ref 13.0–17.1)
MCV: 95.1 fL (ref 79.3–98.0)
MONO#: 0.4 10*3/uL (ref 0.1–0.9)
NEUT#: 0.7 10*3/uL — ABNORMAL LOW (ref 1.5–6.5)
RBC: 3.46 10*6/uL — ABNORMAL LOW (ref 4.20–5.82)
RDW: 15.9 % — ABNORMAL HIGH (ref 11.0–14.6)
WBC: 2.2 10*3/uL — ABNORMAL LOW (ref 4.0–10.3)
lymph#: 1 10*3/uL (ref 0.9–3.3)
nRBC: 0 % (ref 0–0)

## 2010-03-23 ENCOUNTER — Other Ambulatory Visit: Payer: Self-pay | Admitting: Internal Medicine

## 2010-03-23 LAB — COMPREHENSIVE METABOLIC PANEL
ALT: 9 U/L (ref 0–53)
Albumin: 4.5 g/dL (ref 3.5–5.2)
CO2: 27 mEq/L (ref 19–32)
Calcium: 9.8 mg/dL (ref 8.4–10.5)
Chloride: 101 mEq/L (ref 96–112)
Glucose, Bld: 96 mg/dL (ref 70–99)
Potassium: 4.3 mEq/L (ref 3.5–5.3)
Sodium: 138 mEq/L (ref 135–145)
Total Bilirubin: 0.4 mg/dL (ref 0.3–1.2)
Total Protein: 6.7 g/dL (ref 6.0–8.3)

## 2010-03-23 LAB — CBC WITH DIFFERENTIAL/PLATELET
BASO%: 0.4 % (ref 0.0–2.0)
LYMPH%: 20.4 % (ref 14.0–49.0)
MCHC: 33 g/dL (ref 32.0–36.0)
MONO#: 0.6 10*3/uL (ref 0.1–0.9)
NEUT#: 3.3 10*3/uL (ref 1.5–6.5)
Platelets: 296 10*3/uL (ref 140–400)
RBC: 3.48 10*6/uL — ABNORMAL LOW (ref 4.20–5.82)
RDW: 15.1 % — ABNORMAL HIGH (ref 11.0–14.6)
WBC: 5 10*3/uL (ref 4.0–10.3)

## 2010-03-30 ENCOUNTER — Other Ambulatory Visit: Payer: Self-pay | Admitting: Internal Medicine

## 2010-03-30 LAB — COMPREHENSIVE METABOLIC PANEL
ALT: 8 U/L (ref 0–53)
AST: 16 U/L (ref 0–37)
Albumin: 4.1 g/dL (ref 3.5–5.2)
CO2: 25 mEq/L (ref 19–32)
Calcium: 9.3 mg/dL (ref 8.4–10.5)
Chloride: 101 mEq/L (ref 96–112)
Potassium: 4.5 mEq/L (ref 3.5–5.3)
Total Protein: 6.5 g/dL (ref 6.0–8.3)

## 2010-03-30 LAB — CBC WITH DIFFERENTIAL/PLATELET
BASO%: 0.4 % (ref 0.0–2.0)
Eosinophils Absolute: 0.1 10*3/uL (ref 0.0–0.5)
HCT: 33.1 % — ABNORMAL LOW (ref 38.4–49.9)
MCHC: 33.5 g/dL (ref 32.0–36.0)
MONO#: 0.3 10*3/uL (ref 0.1–0.9)
NEUT#: 1.1 10*3/uL — ABNORMAL LOW (ref 1.5–6.5)
RBC: 3.59 10*6/uL — ABNORMAL LOW (ref 4.20–5.82)
WBC: 2.5 10*3/uL — ABNORMAL LOW (ref 4.0–10.3)
lymph#: 0.9 10*3/uL (ref 0.9–3.3)
nRBC: 0 % (ref 0–0)

## 2010-04-11 ENCOUNTER — Ambulatory Visit: Payer: Self-pay | Admitting: Internal Medicine

## 2010-04-11 ENCOUNTER — Ambulatory Visit (HOSPITAL_COMMUNITY)
Admission: RE | Admit: 2010-04-11 | Discharge: 2010-04-11 | Payer: Self-pay | Source: Home / Self Care | Attending: Internal Medicine | Admitting: Internal Medicine

## 2010-04-13 LAB — CBC WITH DIFFERENTIAL/PLATELET
Basophils Absolute: 0 10*3/uL (ref 0.0–0.1)
Eosinophils Absolute: 0.1 10*3/uL (ref 0.0–0.5)
HGB: 10.4 g/dL — ABNORMAL LOW (ref 13.0–17.1)
LYMPH%: 22.7 % (ref 14.0–49.0)
MCV: 91.6 fL (ref 79.3–98.0)
MONO#: 0.9 10*3/uL (ref 0.1–0.9)
MONO%: 17.7 % — ABNORMAL HIGH (ref 0.0–14.0)
NEUT#: 3 10*3/uL (ref 1.5–6.5)
Platelets: 380 10*3/uL (ref 140–400)
RBC: 3.46 10*6/uL — ABNORMAL LOW (ref 4.20–5.82)
WBC: 5.2 10*3/uL (ref 4.0–10.3)

## 2010-05-05 ENCOUNTER — Ambulatory Visit: Payer: Self-pay | Admitting: Internal Medicine

## 2010-05-05 LAB — COMPREHENSIVE METABOLIC PANEL
ALT: 11 U/L (ref 0–53)
AST: 12 U/L (ref 0–37)
Albumin: 4.2 g/dL (ref 3.5–5.2)
Alkaline Phosphatase: 74 U/L (ref 39–117)
BUN: 20 mg/dL (ref 6–23)
CO2: 30 mEq/L (ref 19–32)
Calcium: 9.7 mg/dL (ref 8.4–10.5)
Chloride: 102 mEq/L (ref 96–112)
Creatinine, Ser: 1.28 mg/dL (ref 0.40–1.50)
Glucose, Bld: 120 mg/dL — ABNORMAL HIGH (ref 70–99)
Potassium: 4.1 mEq/L (ref 3.5–5.3)
Sodium: 141 mEq/L (ref 135–145)
Total Bilirubin: 0.6 mg/dL (ref 0.3–1.2)
Total Protein: 6.7 g/dL (ref 6.0–8.3)

## 2010-05-05 LAB — CBC WITH DIFFERENTIAL/PLATELET
BASO%: 0.2 % (ref 0.0–2.0)
Basophils Absolute: 0 10*3/uL (ref 0.0–0.1)
EOS%: 3.5 % (ref 0.0–7.0)
Eosinophils Absolute: 0.2 10*3/uL (ref 0.0–0.5)
HCT: 36.3 % — ABNORMAL LOW (ref 38.4–49.9)
HGB: 11.8 g/dL — ABNORMAL LOW (ref 13.0–17.1)
LYMPH%: 18.1 % (ref 14.0–49.0)
MCH: 29.9 pg (ref 27.2–33.4)
MCHC: 32.5 g/dL (ref 32.0–36.0)
MCV: 92.1 fL (ref 79.3–98.0)
MONO#: 0.7 10*3/uL (ref 0.1–0.9)
MONO%: 10.9 % (ref 0.0–14.0)
NEUT#: 4.2 10*3/uL (ref 1.5–6.5)
NEUT%: 67.3 % (ref 39.0–75.0)
Platelets: 173 10*3/uL (ref 140–400)
RBC: 3.94 10*6/uL — ABNORMAL LOW (ref 4.20–5.82)
RDW: 14.8 % — ABNORMAL HIGH (ref 11.0–14.6)
WBC: 6.3 10*3/uL (ref 4.0–10.3)
lymph#: 1.1 10*3/uL (ref 0.9–3.3)

## 2010-05-22 ENCOUNTER — Encounter: Payer: Self-pay | Admitting: Thoracic Surgery

## 2010-06-07 ENCOUNTER — Other Ambulatory Visit: Payer: Self-pay | Admitting: Internal Medicine

## 2010-06-07 ENCOUNTER — Encounter (HOSPITAL_BASED_OUTPATIENT_CLINIC_OR_DEPARTMENT_OTHER): Payer: Medicare Other | Admitting: Internal Medicine

## 2010-06-07 DIAGNOSIS — C3410 Malignant neoplasm of upper lobe, unspecified bronchus or lung: Secondary | ICD-10-CM

## 2010-06-07 DIAGNOSIS — C3490 Malignant neoplasm of unspecified part of unspecified bronchus or lung: Secondary | ICD-10-CM

## 2010-06-07 LAB — COMPREHENSIVE METABOLIC PANEL
AST: 24 U/L (ref 0–37)
Albumin: 3.9 g/dL (ref 3.5–5.2)
Alkaline Phosphatase: 88 U/L (ref 39–117)
BUN: 32 mg/dL — ABNORMAL HIGH (ref 6–23)
Calcium: 9.9 mg/dL (ref 8.4–10.5)
Chloride: 106 mEq/L (ref 96–112)
Potassium: 4.3 mEq/L (ref 3.5–5.3)
Sodium: 145 mEq/L (ref 135–145)
Total Protein: 7.4 g/dL (ref 6.0–8.3)

## 2010-06-07 LAB — CBC WITH DIFFERENTIAL/PLATELET
Basophils Absolute: 0 10*3/uL (ref 0.0–0.1)
EOS%: 1.4 % (ref 0.0–7.0)
Eosinophils Absolute: 0.2 10*3/uL (ref 0.0–0.5)
HGB: 13.1 g/dL (ref 13.0–17.1)
MCH: 30.2 pg (ref 27.2–33.4)
NEUT#: 10 10*3/uL — ABNORMAL HIGH (ref 1.5–6.5)
RBC: 4.35 10*6/uL (ref 4.20–5.82)
RDW: 14.6 % (ref 11.0–14.6)
lymph#: 0.6 10*3/uL — ABNORMAL LOW (ref 0.9–3.3)

## 2010-06-09 ENCOUNTER — Ambulatory Visit (HOSPITAL_COMMUNITY)
Admission: RE | Admit: 2010-06-09 | Discharge: 2010-06-09 | Disposition: A | Discharge status: Home / Self Care | Payer: Medicare Other | Source: Ambulatory Visit | Attending: Neurology | Admitting: Neurology

## 2010-06-09 ENCOUNTER — Ambulatory Visit (HOSPITAL_COMMUNITY)
Admission: RE | Admit: 2010-06-09 | Discharge: 2010-06-09 | Disposition: A | Discharge status: Home / Self Care | Payer: Medicare Other | Source: Ambulatory Visit | Attending: Physical Therapy | Admitting: Physical Therapy

## 2010-06-09 DIAGNOSIS — I69998 Other sequelae following unspecified cerebrovascular disease: Secondary | ICD-10-CM | POA: Insufficient documentation

## 2010-06-09 DIAGNOSIS — M6281 Muscle weakness (generalized): Secondary | ICD-10-CM | POA: Insufficient documentation

## 2010-06-09 DIAGNOSIS — R269 Unspecified abnormalities of gait and mobility: Secondary | ICD-10-CM | POA: Insufficient documentation

## 2010-06-09 DIAGNOSIS — Z5189 Encounter for other specified aftercare: Secondary | ICD-10-CM | POA: Insufficient documentation

## 2010-06-09 DIAGNOSIS — R482 Apraxia: Secondary | ICD-10-CM | POA: Insufficient documentation

## 2010-06-13 ENCOUNTER — Other Ambulatory Visit: Payer: Self-pay

## 2010-06-13 DIAGNOSIS — I639 Cerebral infarction, unspecified: Secondary | ICD-10-CM

## 2010-06-14 ENCOUNTER — Ambulatory Visit (HOSPITAL_COMMUNITY): Payer: 59 | Admitting: Speech Pathology

## 2010-06-14 ENCOUNTER — Ambulatory Visit (HOSPITAL_COMMUNITY)
Admission: RE | Admit: 2010-06-14 | Discharge: 2010-06-14 | Disposition: A | Discharge status: Home / Self Care | Payer: Medicare Other | Source: Ambulatory Visit | Attending: Internal Medicine | Admitting: Internal Medicine

## 2010-06-14 ENCOUNTER — Other Ambulatory Visit: Payer: Self-pay

## 2010-06-14 ENCOUNTER — Ambulatory Visit (HOSPITAL_COMMUNITY): Payer: 59 | Admitting: Physical Therapy

## 2010-06-14 DIAGNOSIS — I639 Cerebral infarction, unspecified: Secondary | ICD-10-CM

## 2010-06-14 DIAGNOSIS — R1310 Dysphagia, unspecified: Secondary | ICD-10-CM | POA: Insufficient documentation

## 2010-06-16 ENCOUNTER — Ambulatory Visit (HOSPITAL_COMMUNITY): Payer: 59 | Admitting: Physical Therapy

## 2010-06-19 ENCOUNTER — Inpatient Hospital Stay: Admit: 2010-06-19 | Payer: Self-pay | Admitting: Neurology

## 2010-06-19 ENCOUNTER — Emergency Department (HOSPITAL_COMMUNITY): Payer: Medicare Other

## 2010-06-19 ENCOUNTER — Inpatient Hospital Stay (HOSPITAL_COMMUNITY): Payer: Medicare Other

## 2010-06-19 ENCOUNTER — Inpatient Hospital Stay (HOSPITAL_COMMUNITY)
Admission: RE | Admit: 2010-06-19 | Discharge: 2010-06-30 | DRG: 064 | Disposition: E | Payer: Medicare Other | Source: Other Acute Inpatient Hospital | Attending: Internal Medicine | Admitting: Internal Medicine

## 2010-06-19 ENCOUNTER — Emergency Department (HOSPITAL_COMMUNITY)
Admission: EM | Admit: 2010-06-19 | Discharge: 2010-06-19 | Disposition: A | Discharge status: Other Acute Inpatient Hospital | Payer: Medicare Other | Source: Home / Self Care | Attending: Emergency Medicine | Admitting: Emergency Medicine

## 2010-06-19 DIAGNOSIS — C7951 Secondary malignant neoplasm of bone: Secondary | ICD-10-CM | POA: Diagnosis present

## 2010-06-19 DIAGNOSIS — IMO0002 Reserved for concepts with insufficient information to code with codable children: Secondary | ICD-10-CM | POA: Diagnosis present

## 2010-06-19 DIAGNOSIS — I214 Non-ST elevation (NSTEMI) myocardial infarction: Secondary | ICD-10-CM | POA: Diagnosis present

## 2010-06-19 DIAGNOSIS — R791 Abnormal coagulation profile: Secondary | ICD-10-CM | POA: Diagnosis present

## 2010-06-19 DIAGNOSIS — Z87891 Personal history of nicotine dependence: Secondary | ICD-10-CM

## 2010-06-19 DIAGNOSIS — R29810 Facial weakness: Secondary | ICD-10-CM | POA: Diagnosis present

## 2010-06-19 DIAGNOSIS — J449 Chronic obstructive pulmonary disease, unspecified: Secondary | ICD-10-CM | POA: Diagnosis present

## 2010-06-19 DIAGNOSIS — R404 Transient alteration of awareness: Secondary | ICD-10-CM

## 2010-06-19 DIAGNOSIS — C7952 Secondary malignant neoplasm of bone marrow: Secondary | ICD-10-CM | POA: Diagnosis present

## 2010-06-19 DIAGNOSIS — D72829 Elevated white blood cell count, unspecified: Secondary | ICD-10-CM | POA: Diagnosis present

## 2010-06-19 DIAGNOSIS — C7931 Secondary malignant neoplasm of brain: Secondary | ICD-10-CM | POA: Diagnosis present

## 2010-06-19 DIAGNOSIS — D696 Thrombocytopenia, unspecified: Secondary | ICD-10-CM | POA: Insufficient documentation

## 2010-06-19 DIAGNOSIS — Z681 Body mass index (BMI) 19 or less, adult: Secondary | ICD-10-CM

## 2010-06-19 DIAGNOSIS — J4489 Other specified chronic obstructive pulmonary disease: Secondary | ICD-10-CM | POA: Diagnosis present

## 2010-06-19 DIAGNOSIS — R4182 Altered mental status, unspecified: Secondary | ICD-10-CM | POA: Insufficient documentation

## 2010-06-19 DIAGNOSIS — C3490 Malignant neoplasm of unspecified part of unspecified bronchus or lung: Secondary | ICD-10-CM | POA: Diagnosis present

## 2010-06-19 DIAGNOSIS — I6350 Cerebral infarction due to unspecified occlusion or stenosis of unspecified cerebral artery: Secondary | ICD-10-CM | POA: Insufficient documentation

## 2010-06-19 DIAGNOSIS — I10 Essential (primary) hypertension: Secondary | ICD-10-CM | POA: Diagnosis present

## 2010-06-19 DIAGNOSIS — E785 Hyperlipidemia, unspecified: Secondary | ICD-10-CM | POA: Diagnosis present

## 2010-06-19 DIAGNOSIS — E43 Unspecified severe protein-calorie malnutrition: Secondary | ICD-10-CM | POA: Diagnosis present

## 2010-06-19 DIAGNOSIS — E039 Hypothyroidism, unspecified: Secondary | ICD-10-CM | POA: Diagnosis present

## 2010-06-19 DIAGNOSIS — Z515 Encounter for palliative care: Secondary | ICD-10-CM

## 2010-06-19 DIAGNOSIS — N400 Enlarged prostate without lower urinary tract symptoms: Secondary | ICD-10-CM | POA: Diagnosis present

## 2010-06-19 DIAGNOSIS — G40909 Epilepsy, unspecified, not intractable, without status epilepticus: Secondary | ICD-10-CM | POA: Diagnosis present

## 2010-06-19 DIAGNOSIS — Z66 Do not resuscitate: Secondary | ICD-10-CM | POA: Diagnosis present

## 2010-06-19 HISTORY — DX: Essential (primary) hypertension: I10

## 2010-06-19 LAB — CBC
HCT: 34.7 % — ABNORMAL LOW (ref 39.0–52.0)
MCV: 88.7 fL (ref 78.0–100.0)
RBC: 3.91 MIL/uL — ABNORMAL LOW (ref 4.22–5.81)
WBC: 12.4 10*3/uL — ABNORMAL HIGH (ref 4.0–10.5)

## 2010-06-19 LAB — CK TOTAL AND CKMB (NOT AT ARMC)
CK, MB: 3.8 ng/mL (ref 0.3–4.0)
Relative Index: INVALID (ref 0.0–2.5)
Total CK: 87 U/L (ref 7–232)

## 2010-06-19 LAB — CARDIAC PANEL(CRET KIN+CKTOT+MB+TROPI)
CK, MB: 12.5 ng/mL (ref 0.3–4.0)
Relative Index: 10.5 — ABNORMAL HIGH (ref 0.0–2.5)
Total CK: 119 U/L (ref 7–232)

## 2010-06-19 LAB — DIFFERENTIAL
Lymphocytes Relative: 14 % (ref 12–46)
Lymphs Abs: 1.7 10*3/uL (ref 0.7–4.0)
Monocytes Relative: 7 % (ref 3–12)
Neutrophils Relative %: 77 % (ref 43–77)

## 2010-06-19 LAB — COMPREHENSIVE METABOLIC PANEL
ALT: 25 U/L (ref 0–53)
Albumin: 3.4 g/dL — ABNORMAL LOW (ref 3.5–5.2)
BUN: 26 mg/dL — ABNORMAL HIGH (ref 6–23)
Calcium: 9.9 mg/dL (ref 8.4–10.5)
Glucose, Bld: 114 mg/dL — ABNORMAL HIGH (ref 70–99)
Sodium: 143 mEq/L (ref 135–145)
Total Protein: 7 g/dL (ref 6.0–8.3)

## 2010-06-19 LAB — POCT I-STAT, CHEM 8
BUN: 27 mg/dL — ABNORMAL HIGH (ref 6–23)
Calcium, Ion: 1.2 mmol/L (ref 1.12–1.32)
Chloride: 107 mEq/L (ref 96–112)
Creatinine, Ser: 1.5 mg/dL (ref 0.4–1.5)
TCO2: 26 mmol/L (ref 0–100)

## 2010-06-19 LAB — TROPONIN I: Troponin I: 0.29 ng/mL — ABNORMAL HIGH (ref 0.00–0.06)

## 2010-06-19 LAB — PROTIME-INR: Prothrombin Time: 16.4 seconds — ABNORMAL HIGH (ref 11.6–15.2)

## 2010-06-20 ENCOUNTER — Inpatient Hospital Stay (HOSPITAL_COMMUNITY): Payer: Medicare Other

## 2010-06-20 ENCOUNTER — Encounter (HOSPITAL_COMMUNITY): Payer: Self-pay | Admitting: Radiology

## 2010-06-20 LAB — COMPREHENSIVE METABOLIC PANEL
Albumin: 2.8 g/dL — ABNORMAL LOW (ref 3.5–5.2)
BUN: 23 mg/dL (ref 6–23)
Creatinine, Ser: 1.43 mg/dL (ref 0.4–1.5)
GFR calc Af Amer: 59 mL/min — ABNORMAL LOW (ref >60)
Total Protein: 5.8 g/dL — ABNORMAL LOW (ref 6.0–8.3)

## 2010-06-20 LAB — CARDIAC PANEL(CRET KIN+CKTOT+MB+TROPI)
CK, MB: 15 ng/mL (ref 0.3–4.0)
CK, MB: 12.5 ng/mL (ref 0.3–4.0)
Relative Index: 10.1 — ABNORMAL HIGH (ref 0.0–2.5)
Total CK: 149 U/L (ref 7–232)
Total CK: 162 U/L (ref 7–232)
Troponin I: 2.15 ng/mL (ref 0.00–0.06)

## 2010-06-20 LAB — CBC
MCH: 28.5 pg (ref 26.0–34.0)
MCV: 87.5 fL (ref 78.0–100.0)
Platelets: 102 10*3/uL — ABNORMAL LOW (ref 150–400)
RDW: 14.6 % (ref 11.5–15.5)
WBC: 10.3 10*3/uL (ref 4.0–10.5)

## 2010-06-20 LAB — URINALYSIS, MICROSCOPIC ONLY
Bilirubin Urine: NEGATIVE
Ketones, ur: NEGATIVE mg/dL
Protein, ur: 30 mg/dL — AB
Specific Gravity, Urine: 1.015 (ref 1.005–1.030)
Urobilinogen, UA: 1 mg/dL (ref 0.0–1.0)

## 2010-06-20 LAB — GLUCOSE, CAPILLARY: Glucose-Capillary: 117 mg/dL — ABNORMAL HIGH (ref 70–99)

## 2010-06-20 LAB — LIPID PANEL
Cholesterol: 113 mg/dL (ref 0–200)
LDL Cholesterol: 65 mg/dL (ref 0–99)

## 2010-06-20 LAB — DIFFERENTIAL
Basophils Relative: 0 % (ref 0–1)
Eosinophils Absolute: 0.2 10*3/uL (ref 0.0–0.7)
Eosinophils Relative: 2 % (ref 0–5)
Lymphs Abs: 1.1 10*3/uL (ref 0.7–4.0)

## 2010-06-20 MED ORDER — GADOBENATE DIMEGLUMINE 529 MG/ML IV SOLN
14.0000 mL | Freq: Once | INTRAVENOUS | Status: AC
  Administered 2010-06-20: 14 mL via INTRAVENOUS

## 2010-06-20 NOTE — H&P (Signed)
NAMEFERDINANDO, [name]             ACCOUNT NO.:  [id number]  MEDICAL RECORD NO.:  [id number]           PATIENT TYPE:  I  LOCATION:  3311                         FACILITY:  [facility]  PHYSICIAN:  Jeoffrey Massed, MD    DATE OF BIRTH:  01/22/1941  DATE OF ADMISSION:  2010-07-18 DATE OF DISCHARGE:                             HISTORY & PHYSICAL   PRIMARY CARE PRACTITIONER:  Dr. Lilyan Punt.  PRIMARY ONCOLOGIST:  Dr. Si Gaul.  CHIEF COMPLAINT:  Altered mental status and questionable seizure-like activity that started at 10 a.m. this morning.  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  Apparently, his speech was clear.  He was able to walk without any assistance or without any aid and was doing rather well.  The wife stated that he did have some memory and behavioral issues after the surgery.  However, at around 10 o'clock this morning at home, the patient's spouse heard a noise.  The patient was also groaning or grunting.  She then observed him with his hands above his head and they were shaking.  She could not definitely state if it was seizure-like activity, but apparently his eyes were rolled back during this time. This lasted for a few minutes.  The wife is not sure if he had any post ictal state.  He was then brought to [facility] ED for further evaluation.  During this time, he has also had numerous episodes of vomiting and has had one vomiting episode here while at [facility].  In the [facility] ED, a CT of the head was done  which showed an acute/subacute left posterior temporal/parietal region infarct with a small amount of hemorrhage.  Diffuse atrophy was also seen.  During this time, the patient continued to be lethargic and was at times mumbling incoherently.  He was then transferred to [facility] for further evaluation and treatment.  During my evaluation, the patient is lethargic, mumbles incoherently at times but is easily arousable and recognizes his wife and some of his other family members who are in the room.  His speech is certainly not clear, but I do not know if this is yet dysarthric.  He is able to follow all my commands and has been able to lift and move all of his 4 extremities.  The wife denies any recent fever, cough, diarrhea, dysuria.  Patient also shakes head when actually is questioned.  ALLERGIES:  None.  PAST MEDICAL HISTORY: 1. 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 2. Recent likely ischemic infarct discovered during his gamma-knife     surgery in June 02, 2010, in [facility].  The patient was     not given TPA and was treated with aspirin and other conservative     measures. 3. COPD. 4. Hypothyroidism. 5. Hypertension. 6. Dyslipidemia.  PAST SURGICAL HISTORY: 1. The patient has had a lobectomy of most likely the left lung, the     wife is not sure. 2. He has also had a thyroidectomy for goiter.  FAMILY HISTORY:  There is no family history of stroke or malignancies in the family.  SOCIAL HISTORY:  The patient lives with his wife.  He used to drive a truck.  He quit smoking in July 2010.  He denies any other toxic habits.  REVIEW OF SYSTEMS:  A detailed review of 12 systems was done and these are negative except for the ones mentioned in the HPI.  PHYSICAL EXAM:   VITAL SIGNS:  His last blood pressure is 154/75; pulse ox 100% on 2 L nasal cannula, heart rate of 65, afebrile. GENERAL:  An elderly African American male lying in bed, tachypneic, but not in acute distress, lethargic, mumbling incoherently.  Easily arousable, following all commands.  Speech not clear, somewhat dysarthric but communicating reasonably well.  At times, he mumbles incoherently as well. HEENT: Atraumatic, normocephalic.  Pupils equal, reactive to light and accommodation.  Has left facial droop. Oral mucosa is moist. NECK:  Supple.  No JVD. CARDIOVASCULAR:  Heart sounds are regular.  No murmurs heard. LUNGS:  Bilaterally clear to auscultation. ABDOMEN:  Soft, nontender, nondistended. EXTREMITIES:  There is no edema. SKIN:  No rash. NEUROLOGIC:  Mental status is as above.  He appears to have adequate 5/5 strength in all of his 4 extremities.  Plantars are downgoing bilaterally and deep tendon reflexes are 2+ bilaterally as well.  LABORATORY DATA: 1. WBC is 12.4, hemoglobin is 11.3, hematocrit is 34.7, MCV 8.7,     platelet count of 118.  Neutrophils of 77%. 2. Chemistry shows sodium of 143, potassium of 4.3, chloride 106,bicarb of 27, glucose of 114, BUN of 26, creatinine 1.39. 3. LFTs show a total bilirubin 0.8, alkaline phosphatase of 103, AST     of 26, ALT of 25, total protein of 7, albumin of 3.4, calcium of     9.9. 4. Set of troponins are elevated at 0.29. 5. INR is 1.30, PTT is 25.  RADIOLOGICAL STUDIES: 1. X-ray of the chest shows no active lung disease.  Elevation of the     left hemidiaphragm may be chronic. 2. CT of the head without contrast showed acute/subacute left     posterior temporal/parietal infarct with perhaps a small amount of     hemorrhage.  Diffuse atrophy.  ASSESSMENT: 1. Sudden onset of altered mental status possibly secondary to new     acute/subacute cerebrovascular accident. 2. Cannot rule out underlying seizure-like activity as well  given the     patient's history and his history of brain mets. 3. Leukocytosis with no focal infection evident. 4. Uncontrolled hypertension. 5. Positive for a set of cardiac enzymes. 6. History of lung cancer with brain mets, status post gamma knife in     June 02, 2010, and also likely a questionable history of prior     cervical spine mets status post radiation. 7. Code blue/DNR.  PLAN: 1. The patient is already here in Unit  3300 and we will keep him here. 2. Given his CT findings, a small amount of hemorrhage, we will hold     off on starting his aspirin for now.  We will repeat imaging in the     morning.  If there is no progression of this hemorrhage, he will     then be started on aspirin.  This was also discussed with Dr.     Thad Ranger of Neurology who seemed to be in agreement with this. 3. We will start the patient empirically on Keppra for now.  We will     obtain EEG. 4. We will obtain an MRI, MRA of his brain in the morning. 5. We will control his blood pressure for now with p.r.n. hydralazine     and labetalol.  If he does pass his swallow screen, we will start     him on amlodipine. 6. We will use SCDs for DVT prophylaxis for now and assess his scans     in the morning before starting him on prophylactic heparin or     Lovenox. 7. Regarding his positive troponins, I am not sure whether this is a     false positive elevation or whether this is stress-induced     ischemia, in any event with acute cerebrovascular accident likely     stage IV malignant disease and DNR status and treatment obviously     will be conservative for now.  We will not call Cardiology unless     these troponin enzymes are significantly elevated in the course.     We will obtain a 2-D echocardiogram.  Unfortunately, given his     small amount of hemorrhage in the drain, patient is not a candidate     for aspirin as well as Lovenox or heparin for now.  We will manage     this issue  conservatively. 8. The patient will be monitored closely.  His clinical course will be     followed.  Repeat radiological scans will be done in the morning.     He does have a left facial droop that is not explained by the CT     head findings.  Obviously, hopefully the MRI will give [place] some more     information. 9. The patient's code status is a DNR/DNI and this was confirmed in     detail by me and with his spouse, Ms. Jefm Bryant, at his     bedside. 10.Total time spent equal to 60 minutes.     Jeoffrey Massed, MD     SG/MEDQ  D:  06/24/2010  T:  06/01/2010  Job:  [id number]  cc:   Lorin Picket A. Gerda Diss, MD Lajuana Matte, MD  Electronically Signed by Jeoffrey Massed  on 06/20/2010 04:37:53 PM

## 2010-06-21 ENCOUNTER — Ambulatory Visit (HOSPITAL_COMMUNITY): Payer: 59 | Admitting: Physical Therapy

## 2010-06-21 LAB — CBC
MCH: 28.1 pg (ref 26.0–34.0)
MCV: 86.7 fL (ref 78.0–100.0)
Platelets: 115 10*3/uL — ABNORMAL LOW (ref 150–400)
RBC: 3.77 MIL/uL — ABNORMAL LOW (ref 4.22–5.81)

## 2010-06-21 LAB — DIFFERENTIAL
Eosinophils Absolute: 0.3 10*3/uL (ref 0.0–0.7)
Eosinophils Relative: 3 % (ref 0–5)
Lymphs Abs: 1.1 10*3/uL (ref 0.7–4.0)
Monocytes Relative: 7 % (ref 3–12)
Neutrophils Relative %: 79 % — ABNORMAL HIGH (ref 43–77)

## 2010-06-21 LAB — CARDIAC PANEL(CRET KIN+CKTOT+MB+TROPI): Troponin I: 1.36 ng/mL (ref 0.00–0.06)

## 2010-06-21 LAB — BASIC METABOLIC PANEL
BUN: 23 mg/dL (ref 6–23)
Chloride: 110 mEq/L (ref 96–112)
Creatinine, Ser: 1.48 mg/dL (ref 0.4–1.5)

## 2010-06-21 NOTE — Procedures (Signed)
EEG NUMBER:  [id number].  This is a portable EEG recording for a patient that is presenting as lethargic and difficult to arouse.  This right-handed male patient was admitted on 07-18-2010, for stroke followed by a seizure with complex partial nature.  In January, he had been diagnosed with a subacute CVA in the left temporal lobe.  The patient has a history of lung cancer and has metastasis to the brain.  He has periods of lip smacking and automatism during this EEG recording.  CURRENT MEDICATIONS:  Protonix, Crestor, Flomax, Keppra, and Zofran.  This 16-channel EEG recording was one channel representing heart rate and rhythm exclusively was performed without hyperventilation but with photic stimulation.  The patient has a slow sinus rhythm.  EKG remains between 54 and 62 beats per minute.  DESCRIPTION:  A posterior dominant background rhythm was established after the technician stimulated the patient.  Initially, the patient seemed to be asleep, was resting quietly, slightly snoring with his eyes closed.  There was no facial movement at the time the EEG began and the posterior dominant rhythm after stimulation shows 8 Hz frequencies. There is significant amplitude suppression at T5 and T3 which is probably the area of the described stroke.  This slowing is not seen over the right hemisphere.  EKG shows no irregular heart beat but sinus rhythm in the lower range frequencies.  Photic stimulation entrained at 8 and 12 Hz.  Epileptiform activity did not result, but electromyographic artifact in form of muscle twitching and tension is noted.  CONCLUSION:  This is an abnormal EEG due to its focal slowing over the T5-T3 region and partially central 3 leads indicating that the area of the described stroke is likely the area of suppression and slowing. Epileptiform activity is not noted.  Photic entrainment was seen at frequencies close to the natural posterior dominant rhythm.  Please  note that there were isolated phase reversal discharges seen over C4 which do not correlate with the described stroke and may correlate with the metastasis described in the history of which I do not know the location. Given the described seizure activity, this patient should remain on antiepileptic drugs of which he is currently already taking Keppra.     Melvyn Novas, M.D.    YQ:MVHQ D:  06/20/2010 15:20:45  T:  06/20/2010 22:20:17  Job #:  [id number]

## 2010-06-22 LAB — BASIC METABOLIC PANEL
BUN: 21 mg/dL (ref 6–23)
CO2: 25 mEq/L (ref 19–32)
Chloride: 109 mEq/L (ref 96–112)
Glucose, Bld: 85 mg/dL (ref 70–99)
Potassium: 4.1 mEq/L (ref 3.5–5.1)

## 2010-06-22 LAB — CBC
HCT: 32.1 % — ABNORMAL LOW (ref 39.0–52.0)
MCV: 86.3 fL (ref 78.0–100.0)
RBC: 3.72 MIL/uL — ABNORMAL LOW (ref 4.22–5.81)
RDW: 14.4 % (ref 11.5–15.5)
WBC: 9.9 10*3/uL (ref 4.0–10.5)

## 2010-06-22 NOTE — Consult Note (Signed)
NAME:  [name], [name]             ACCOUNT NO.:  [id number]  MEDICAL RECORD NO.:  [id number]           PATIENT TYPE:  I  LOCATION:  3311                         FACILITY:  [facility]  PHYSICIAN:  Italy Hilty, MD         DATE OF BIRTH:  Jul 20, 1940  DATE OF CONSULTATION:  06/20/2010 DATE OF DISCHARGE:                                CONSULTATION   HISTORY OF PRESENT ILLNESS:  [name] is a pleasant 70 year old male who is followed by Dr. Lilyan Punt in [place].  He has no prior history of coronary artery disease or any history of prior cardiac testing.  He has a history of lung cancer with metastasis to his brain. He had lobectomy in July 2010 and gamma knife surgery in July 2011 and again June 02, 2010 at [facility].  This was complicated by CVA, he was treated with TPA and ultimately discharged on June 07, 2010.  He is admitted now on June 19, 2010 after mental status changes at home. The patient's wife says that he became confused and was flailing his arms.  He was brought to the emergency room for further evaluation.  It appears that the patient had an acute left hemispheric infarct with possible new onset of seizures.  His troponins are positive and we are asked to see him in consult for this.  His troponin on admission was 0.29, second troponin was 1.74 and third 2.64.  His CKs have steadily climbed, his third CK is 149 with 15 MBs.  The patient denies chest pain.  His EKG shows sinus rhythm without acute changes.  He is awake and alert now and answering questions.  PAST MEDICAL HISTORY:  Remarkable for COPD, he quit smoking in July 2012.  He has treated hypothyroidism and has had previous surgery for thyroid problems.  He has hypertension and dyslipidemia.  He has BPH. As noted above, he has lung cancer with metastasis to his brain with gamma knife surgery twice, once in July 2011 and again on June 02, 2010.  The patient is status post CVA treated with TPA in  February 2012 after his gamma knife surgery.  HOME MEDICATIONS: 1. Hydralazine 25 mg three times a day, this is depending on his blood     pressure. 2. Spiriva inhaler daily. 3. Synthroid 0.175 mg a day. 4. Flomax 0.4 mg a day. 5. Crestor 10 mg a day. 6. Verapamil 180 mg a day, this is also depending on his blood     pressure. 7. Tarceva 150 mg a day. 8. Hydrocodone and acetaminophen 5/500 for pain. 9. Megace 2 teaspoons twice a day. 10.Aspirin 81 mg a day.  ALLERGIES:  The patient has no known drug allergies.  SOCIAL HISTORY:  He is married.  They have no children.  He used to be a [employer] fund[employer].  Quit smoking in July 2012.  FAMILY HISTORY:  Unremarkable for coronary disease according to the patient's wife.  REVIEW OF SYSTEMS:  Essentially unremarkable except for above.  See history and physical for complete details.  PHYSICAL EXAMINATION:  VITAL SIGNS:  Blood pressure 138/72, pulse 72, temperature 98,  O2 sat is 99 on room air. GENERAL:  He is an elderly chronically ill-appearing African American male in no acute distress. HEENT:  Normocephalic, atraumatic.  Extraocular movements are intact. Sclerae anicteric. NECK:  Without JVD or bruit. CHEST:  Diminished breath sounds at the right base with clear lung fields. CARDIAC:  Diminished heart sounds with regular rate and rhythm.  No obvious murmur or rub. ABDOMEN:  Not distended, nontender. EXTREMITIES:  No edema.  Distal pulses are 3+/4. NEURO:  Grossly intact.  He is awake, alert, and able to answer questions, but is unable to give me any details of his past medical history.  He seems to move his upper and lower extremities without obvious deficit and his grip is good in both upper extremities. SKIN:  Cool and dry.  LABORATORY DATA: 1. White count 10.3, hemoglobin 10.0, hematocrit 30.7, platelets 102.     Sodium 137, potassium 4.3, BUN 23, creatinine 1.43.  CKs peaked at     149, 15 MBs, and a troponin 2.64.   This is the last set that was     drawn, another set is pending. 2. TSH 17.08. 3. Cholesterol 113 with an HDL of 35 and LDL of 65.  DIAGNOSTIC STUDIES:  EKG shows sinus rhythm with nonspecific ST changes. MRI showing left temporal, parietal, and occipital subacute ischemia with relatively acute ischemia involving the right frontal subcortical white matter in the right cerebellar hemisphere.  Chest x-ray shows elevation of the left hemidiaphragm.  IMPRESSION: 1. Non-ST elevation myocardial infarction by CK-MB and troponin. 2. Recent cerebrovascular accident at [facility] treated with TPA,     discharged June 07, 2010 with new CVA this admission with     seizure. 3. Lung cancer with brain metastasis, status post gamma knife surgery     recently on June 02, 2010. 4. Chronic obstructive pulmonary disease and smoking history. 5. Hypothyroidism with an elevated TSH. 6. Treated hypertension. 7. Treated dyslipidemia. 8. Benign prostatic hypertrophy. 9. Patient is DNR.  PLAN:  We will go ahead and check another set of cardiac enzymes. Echocardiogram will be ordered.  The patient will be seen by cardiologist this afternoon.  Further recommendations to follow.     Abelino Derrick, P.A.   ______________________________ [place] Hilty, MD    LKK/MEDQ  D:  06/20/2010  T:  06/20/2010  Job:  [id number]  cc:   [place]aly Hilty, MD  Electronically Signed by Corine Shelter P.A. on 06/20/2010 03:34:18 PM Electronically Signed by Kirtland Bouchard. HILTY M.D. on 06/22/2010 08:05:22 AM

## 2010-06-23 ENCOUNTER — Ambulatory Visit (HOSPITAL_COMMUNITY): Payer: 59

## 2010-06-23 LAB — CBC
HCT: 31.1 % — ABNORMAL LOW (ref 39.0–52.0)
MCHC: 33.8 g/dL (ref 30.0–36.0)
MCV: 85.2 fL (ref 78.0–100.0)
RDW: 14.5 % (ref 11.5–15.5)

## 2010-06-23 LAB — BASIC METABOLIC PANEL
BUN: 17 mg/dL (ref 6–23)
Calcium: 9 mg/dL (ref 8.4–10.5)
GFR calc non Af Amer: 53 mL/min — ABNORMAL LOW (ref >60)
Glucose, Bld: 87 mg/dL (ref 70–99)

## 2010-06-25 NOTE — Consult Note (Signed)
NAMEEARLY, STEEL NO.:  [id number]  MEDICAL RECORD NO.:  [id number]           PATIENT TYPE:  I  LOCATION:  3311                         FACILITY:  [facility]  PHYSICIAN:  Thana Farr, MD    DATE OF BIRTH:  01-08-1941  DATE OF CONSULTATION:  06/10/2010 DATE OF DISCHARGE:                                CONSULTATION   Consult called by Dr. Jerral Ralph.  REFERRING PROVIDER:  Dr. Jerral Ralph.  HISTORY:  Mr. Stolp is a 70 year old male with documented lung CA. The patient was diagnosed with lung CA in 2010.  Last year was found to have a brain mass and had a Gamma knife in the summer of last year. Another metastasis was found at the end of this year.  The patient underwent another Gamma knife procedure early this month.  At that time, an acute stroke was noted as well.  The patient was admitted to [facility] at that time and workup was initiated.  The patient was discharged on aspirin. Today, the patient's wife heard a sound in the house.  When she found her husband, he was on the couch, flailing his arms.  He was unresponsive.  His eyes were rolled back in his head.  He had an episode of vomiting, then EMS was called.  The patient was seen at [facility].  Head CT was performed that showed a left posterior temporoparietal area of hypodensity that was felt to be an acute infarct.  There was some associated hyperdensity that was felt to be possible associated hemorrhage.  A consult was called for evaluation. The patient was transferred to [facility] for admission.  The patient's residual from his stroke earlier this month was felt to only be cognitive.  The patient did not have any limb weakness on numbness. Code stroke was called at [facility].  The patient was not considered a t-PA candidate due to his recent stroke and possible hemorrhage on imaging.  PAST MEDICAL HISTORY: 1. Stroke earlier this month. 2. Hypercholesterolemia. 3. Hypothyroidism. 4. Lung  CA. 5. Hypertension. 6. Benign prostatic hypertrophy.  MEDICATIONS:  Hydralazine, Spiriva, Synthroid, Flomax, Crestor, verapamil, Tarceva, hydrocodone, megestrol, aspirin.  SOCIAL HISTORY:  The patient has a history of smoking.  There is no history of alcohol or illicit drug abuse.  He is married.  PHYSICAL EXAMINATION:  VITAL SIGNS:  Blood pressure 163/77, heart rate 66, respiratory rate 80, temperature 97.5. NEUROLOGICAL:  On mental status testing, the patient is lying in bed, alert but with his eyes closed.  He is moaning and visibly uncomfortable.  He follows commands.  He is constantly repeating that he is very tired.  On cranial nerve testing:  II, visual fields are grossly intact.  III, IV, VI; the patient does not cross the midline with extraocular movements to the left.  V and VII, left facial droop.  VIII, grossly intact.  IX and X, decreased gag.  XI, bilateral shoulder shrug. XII, midline tongue extension.  On motor exam, the patient is 5/5 throughout.  There is normal tone and bulk.  On sensory testing, the patient reports intact pinprick and light touch bilaterally.  Deep tendon reflexes are 1+ throughout with absent ankle jerks.  Plantars are downgoing bilaterally.  Cerebellar testing cannot be performed secondary to the patient's unwillingness to cooperate at this time.  On laboratory testing, PT/INR and PTT of 16.4, 1.3, and 25 respectively. White blood cell count is 12.4, platelet count is 118, hemoglobin and hematocrit are 11.3 and 34.7 respectively.  Calcium 9.9, sodium 143, potassium 4.3, chloride 106, CO2 of 27, BUN and creatinine of 26 and 1.39 respectively.  Glucose 114, protein 7.0, SGOT and SGPT 26 and 25, alk phos 103, total bili 0.8.  CT of the head shows acute to subacute left posterior temporoparietal infarct with questionably associated hemorrhage.  ASSESSMENT:  Mr. Lamere is a 70 year old male with acute left hemispheric infarct and possible  new-onset seizures related to same. The patient's event is likely related to a hypercoagulable state related to his neoplasm.  This is now his second within the same month.  It is also unclear if the episode that was noted by his wife was a seizure. The patient is not a t-PA candidate due to his recent stroke but will be treated otherwise appropriately.  PLAN: 1. Keppra 500 mg b.i.d. 2. EEG. 3. Seizure precautions. 4. MRI of the brain. 5. Echocardiogram.          ______________________________ Thana Farr, MD     LR/MEDQ  D:  06/21/10  T:  06/20/2010  Job:  [id number]  Electronically Signed by Thana Farr MD on 06/25/2010 04:57:25 PM

## 2010-06-30 NOTE — Discharge Summary (Signed)
[name], [name]             ACCOUNT NO.:  [id number]  MEDICAL RECORD NO.:  [id number]           PATIENT TYPE:  I  LOCATION:  4533                         FACILITY:  [facility]  PHYSICIAN:  Andreas Blower, MD       DATE OF BIRTH:  [birth year]  DATE OF ADMISSION:  2010/07/04 DATE OF DISCHARGE:  06/06/2010                              DISCHARGE SUMMARY   DEATH SUMMARY  PRIMARY CARE PHYSICIAN:  Scott A. Gerda Diss, MD  PRIMARY ONCOLOGIST:  Lajuana Matte, MD  DISCHARGE DIAGNOSES: 1. Acute cerebrovascular accident. 2. Complex partial seizures. 3. Non-ST elevation myocardial infarction. 4. Acute delirium. 5. Hypertension. 6. 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 7. History of chronic obstructive pulmonary disease. 8. Benign prostatic hypertrophy. 9. Severe malnutrition. 10.Hyperlipidemia.  BRIEF ADMITTING HISTORY AND PHYSICAL:  Mr. [name] is a 70 year old gentleman with a past medical history of lung cancer with metastasis to his brain with recent CVA status post gamma knife surgery on June 02, 2010, who had initially presented to [facility] and was discharged on February 7 for CVA and status post gamma knife surgery. He presented on 07-04-2010, with altered mental status and questionable seizure-like activity.  CONSULTATIONS: 1. [facility] evaluated the patient during the course of the     hospital stay. 2. Dr. Rennis Golden with Cardiology evaluated the patient during the course     of the hospital stay.  Palliative Care also evaluated the patient     during the course of the hospital stay.  RADIOLOGY/IMAGING:  The patient had a head CT without contrast on 04-Jul-2010, which shows acute/subacute left posterior temporoparietal infarct with small amount of hemorrhage.  Diffuse atrophy noted. The patient had portable chest x-ray on July 04, 2010, which  shows no active lung disease. The patient had an MRI of the head and neck on June 20, 2010, which showed areas of restriction diffusion involving the left temporal, parietal and occipital cortical areas with enhancement.  Suggestion of relatively acute ischemia involving the right frontal subcortical white matter, the right cerebellar hemisphere and the vermis, 8.5 mm right choroidal fissure cyst and incidental finding.  Mild diffuse atrophy, inflammatory mucosal thickening of the paranasal sinus. The patient had a head CT without contrast on June 28, 2010, which showed evolving infarcts.  No hemorrhage or other new abnormality. The patient had a 2-D echocardiogram on June 21, 2010, which showed systolic function was normal.  Ejection fraction was 60-65%.  Wall motion was normal.  There were no regional wall motion abnormalities. Doppler parameters are consistent with abnormal left ventricular relaxation (grade 1 diastolic dysfunction).  LABORATORY FINDINGS:  CBC shows a white count of 10.2, hemoglobin 10.5, hematocrit 31.1, platelet count 103.  Electrolytes normal with a creatinine of 1.33.  HOSPITAL COURSE:  Mr. [name] is a 70 year old African American gentleman with a history of non-small cell cancer stage IV with metastasis to the brain, recently was admitted to [facility] for CVA, had gamma knife surgery for brain mets, was discharged  on February 7, presented on 07-15-10, with altered mental status. The patient was evaluated by Neurology for questionable seizures.  He was started on Keppra.  The patient also had non-ST-elevation MI. Cardiology was also consulted as well.  Given his multiple comorbidities and after much discussion with the patient's wife, the family and patient elected for comfort care management after discussion with Palliative Care.  The wife also did not want any further aggressive medical workup as well.  The patient was  transferred to the Palliative Care Unit on June 24, 2010.  Since the patient was transferred to the Palliative Care Unit, the patient declined rapidly.  The patient was unresponsive on June 26, 2010.  On June 27, 2010, he developed a fever of 103.6 with a blood pressure of 66/38.  It was noted by the nursing staff at 9:50, the patient stopped breathing, had no heart beat verified.  The patient's time of death was called at 9:50 a.m. on June 27, 2010.  I spoke with Dr. Arbutus Ped and Dr. Lilyan Punt, his oncologist, and his primary care physician and notified them that Mr. [name] had died.  I offered my condolences to the patient's wife at bedside and offered her any assistance that I could provide for her.   Andreas Blower, MD   SR/MEDQ  D:  06/23/2010  T:  06/15/2010  Job:  [id number]  Electronically Signed by Wardell Heath Ikeisha Blumberg  on 06/06/2010 09:19:32 PM

## 2010-06-30 DEATH — deceased

## 2010-07-01 ENCOUNTER — Other Ambulatory Visit (HOSPITAL_COMMUNITY): Payer: 59

## 2010-07-20 NOTE — Consult Note (Signed)
[name], LOHMEYER             ACCOUNT NO.:  [id number]  MEDICAL RECORD NO.:  [id number]           PATIENT TYPE:  I  LOCATION:  3311                         FACILITY:  [facility]  PHYSICIAN:  Matson Welch L. Ladona Ridgel, MD  DATE OF BIRTH:  01-13-1941  DATE OF CONSULTATION:  06/22/2010 DATE OF DISCHARGE:                                CONSULTATION   COLLABORATING PHYSICIAN:  Estiben Mizuno L. Ladona Ridgel, MD  PRIMARY CARE PHYSICIAN:  Scott A. Gerda Diss, MD  ONCOLOGIST:  Lajuana Matte, MD  REASON FOR CONSULTATION:  Goals of care.  This nurse practitioner, Dorian Pod, received report from the team, reviewed medical records, spoke with Dr. Betti Cruz, spoke with nursing staff, examined the patient, then met with the patient's wife, Idus Rathke, to discuss goals, options, and end-of-life issues.  Ms. Hermiz can be reached at [phone] or [phone].  A detailed discussion was had regarding advanced directives with concept specific to the difference between the aggressive medical interventions path versus a palliative comfort care path for this patient at this time in his current situation.  Time in 10:30.  Time out 11:45 with a 75-minute consultation.  Greater than 50% of this time was spent in medical counseling, coordination of care, and discussion of the pathophysiology of the patient's disease process.  At this time, Mr. Gluth problem list includes: 1. Altered mental status in the setting of recent CVA, seizure     activity, and metastatic brain cancer. 2. Anorexia with weight loss. 3. Failure to thrive. 4. Agitation with personality change following diagnosis of brain     cancer and stroke. 5. Alteration in communication verbally and comprehension.  Goals of care at this time are established by his wife, Samara Deist. 1. Confirm DNR/DNI status. 2. Nutrition. 3. Labs and diagnostic testing. 4. Antibiotics.  These all decisions will be made at a later date.  I have given  Ms. Goodenow the Hard Choices booklet after our discussion.  The risks and benefits of each of these have been discussed.  For now Samara Deist would like to speak with her extended family members as they have no children of their own and get input from the patient's sisters and her sisters before she makes further decisions.  Disposition at this time is pending.  Samara Deist is contemplating taking Mr. Conroy back home with the assistance of hospice.  I have strongly encouraged her to consider this. He will need 24/7 care and she does not have available resources to hire care providers.  The patient would benefit from the hospice with his diagnosis of cancer with metastatic disease.  However, he is not appropriate for hospice home yet on this date.  We will follow along with the patient.  Hopefully, Samara Deist can make some decisions over the next couple of days.  The patient has pending a TEE tomorrow.  I have talked with her length about this and what this would involved for Mr. Vitrano, and that she will need to discuss this further with the cardiologist as to whether or not he would be an appropriate candidate for a sedation and TEE as it is an invasive procedure.  Dr. Betti Cruz  has been notified the results of his goals of care.  Thank you for the opportunity to assist with this patient's care.  This is Dorian Pod, I can be reached at [phone].     Dorian Pod, ACNP   ______________________________ Katharina Caper. Ladona Ridgel, MD    MB/MEDQ  D:  06/22/2010  T:  06/23/2010  Job:  [id number]  Electronically Signed by Dorian Pod ACNP on 07/13/2010 08:38:46 AM Electronically Signed by Derenda Mis MD on 07/20/2010 07:34:49 AM

## 2010-08-04 LAB — URINALYSIS, ROUTINE W REFLEX MICROSCOPIC
Glucose, UA: NEGATIVE mg/dL
Hgb urine dipstick: NEGATIVE
Ketones, ur: NEGATIVE mg/dL
Protein, ur: NEGATIVE mg/dL

## 2010-08-04 LAB — CROSSMATCH

## 2010-08-04 LAB — ABO/RH: ABO/RH(D): O POS

## 2010-08-07 LAB — CBC
HCT: 31.6 % — ABNORMAL LOW (ref 39.0–52.0)
HCT: 32.3 % — ABNORMAL LOW (ref 39.0–52.0)
HCT: 30.5 % — ABNORMAL LOW (ref 39.0–52.0)
HCT: 31.3 % — ABNORMAL LOW (ref 39.0–52.0)
HCT: 35.5 % — ABNORMAL LOW (ref 39.0–52.0)
Hemoglobin: 10.5 g/dL — ABNORMAL LOW (ref 13.0–17.0)
Hemoglobin: 10.8 g/dL — ABNORMAL LOW (ref 13.0–17.0)
Hemoglobin: 10.3 g/dL — ABNORMAL LOW (ref 13.0–17.0)
Hemoglobin: 10.5 g/dL — ABNORMAL LOW (ref 13.0–17.0)
Hemoglobin: 11.2 g/dL — ABNORMAL LOW (ref 13.0–17.0)
Hemoglobin: 12 g/dL — ABNORMAL LOW (ref 13.0–17.0)
Hemoglobin: 13.1 g/dL (ref 13.0–17.0)
MCHC: 33.3 g/dL (ref 30.0–36.0)
MCHC: 33.3 g/dL (ref 30.0–36.0)
MCHC: 33.6 g/dL (ref 30.0–36.0)
MCHC: 33.8 g/dL (ref 30.0–36.0)
MCHC: 33.8 g/dL (ref 30.0–36.0)
MCHC: 33.9 g/dL (ref 30.0–36.0)
MCHC: 33.9 g/dL (ref 30.0–36.0)
MCV: 93.6 fL (ref 78.0–100.0)
MCV: 95.4 fL (ref 78.0–100.0)
MCV: 93.2 fL (ref 78.0–100.0)
MCV: 94.1 fL (ref 78.0–100.0)
Platelets: 239 10*3/uL (ref 150–400)
Platelets: 287 10*3/uL (ref 150–400)
Platelets: 166 10*3/uL (ref 150–400)
Platelets: 176 10*3/uL (ref 150–400)
Platelets: 179 10*3/uL (ref 150–400)
Platelets: 195 10*3/uL (ref 150–400)
RBC: 3.32 MIL/uL — ABNORMAL LOW (ref 4.22–5.81)
RBC: 3.46 MIL/uL — ABNORMAL LOW (ref 4.22–5.81)
RBC: 3.33 MIL/uL — ABNORMAL LOW (ref 4.22–5.81)
RBC: 3.81 MIL/uL — ABNORMAL LOW (ref 4.22–5.81)
RBC: 4.12 MIL/uL — ABNORMAL LOW (ref 4.22–5.81)
RDW: 13.7 % (ref 11.5–15.5)
RDW: 13.9 % (ref 11.5–15.5)
RDW: 13.6 % (ref 11.5–15.5)
RDW: 13.6 % (ref 11.5–15.5)
RDW: 14 % (ref 11.5–15.5)
RDW: 14 % (ref 11.5–15.5)
WBC: 10.8 10*3/uL — ABNORMAL HIGH (ref 4.0–10.5)
WBC: 8.4 10*3/uL (ref 4.0–10.5)
WBC: 11.3 10*3/uL — ABNORMAL HIGH (ref 4.0–10.5)
WBC: 15.3 10*3/uL — ABNORMAL HIGH (ref 4.0–10.5)
WBC: 6.2 10*3/uL (ref 4.0–10.5)

## 2010-08-07 LAB — BASIC METABOLIC PANEL
BUN: 20 mg/dL (ref 6–23)
BUN: 14 mg/dL (ref 6–23)
BUN: 15 mg/dL (ref 6–23)
BUN: 20 mg/dL (ref 6–23)
BUN: 25 mg/dL — ABNORMAL HIGH (ref 6–23)
CO2: 28 mEq/L (ref 19–32)
CO2: 30 mEq/L (ref 19–32)
CO2: 23 mEq/L (ref 19–32)
CO2: 27 mEq/L (ref 19–32)
CO2: 31 mEq/L (ref 19–32)
Calcium: 8.5 mg/dL (ref 8.4–10.5)
Calcium: 8.8 mg/dL (ref 8.4–10.5)
Calcium: 8.8 mg/dL (ref 8.4–10.5)
Calcium: 8.4 mg/dL (ref 8.4–10.5)
Calcium: 8.5 mg/dL (ref 8.4–10.5)
Calcium: 8.5 mg/dL (ref 8.4–10.5)
Chloride: 99 mEq/L (ref 96–112)
Chloride: 104 mEq/L (ref 96–112)
Chloride: 98 mEq/L (ref 96–112)
Creatinine, Ser: 1.55 mg/dL — ABNORMAL HIGH (ref 0.4–1.5)
Creatinine, Ser: 1.61 mg/dL — ABNORMAL HIGH (ref 0.4–1.5)
Creatinine, Ser: 1.61 mg/dL — ABNORMAL HIGH (ref 0.4–1.5)
Creatinine, Ser: 1.39 mg/dL (ref 0.4–1.5)
GFR calc Af Amer: 52 mL/min — ABNORMAL LOW (ref >60)
GFR calc Af Amer: 52 mL/min — ABNORMAL LOW (ref >60)
GFR calc Af Amer: 54 mL/min — ABNORMAL LOW (ref >60)
GFR calc Af Amer: >60 mL/min (ref >60)
GFR calc non Af Amer: 43 mL/min — ABNORMAL LOW (ref >60)
GFR calc non Af Amer: 45 mL/min — ABNORMAL LOW (ref >60)
GFR calc non Af Amer: 51 mL/min — ABNORMAL LOW (ref >60)
GFR calc non Af Amer: 57 mL/min — ABNORMAL LOW (ref >60)
GFR calc non Af Amer: >60 mL/min (ref >60)
Glucose, Bld: 79 mg/dL (ref 70–99)
Glucose, Bld: 88 mg/dL (ref 70–99)
Glucose, Bld: 113 mg/dL — ABNORMAL HIGH (ref 70–99)
Glucose, Bld: 114 mg/dL — ABNORMAL HIGH (ref 70–99)
Glucose, Bld: 121 mg/dL — ABNORMAL HIGH (ref 70–99)
Glucose, Bld: 173 mg/dL — ABNORMAL HIGH (ref 70–99)
Potassium: 4.1 mEq/L (ref 3.5–5.1)
Potassium: 3.6 mEq/L (ref 3.5–5.1)
Potassium: 4.5 mEq/L (ref 3.5–5.1)
Sodium: 136 mEq/L (ref 135–145)
Sodium: 140 mEq/L (ref 135–145)
Sodium: 130 mEq/L — ABNORMAL LOW (ref 135–145)
Sodium: 133 mEq/L — ABNORMAL LOW (ref 135–145)
Sodium: 135 mEq/L (ref 135–145)
Sodium: 136 mEq/L (ref 135–145)

## 2010-08-07 LAB — COMPREHENSIVE METABOLIC PANEL
ALT: 10 U/L (ref 0–53)
ALT: 16 U/L (ref 0–53)
AST: 13 U/L (ref 0–37)
AST: 34 U/L (ref 0–37)
Albumin: 3.1 g/dL — ABNORMAL LOW (ref 3.5–5.2)
Albumin: 3.7 g/dL (ref 3.5–5.2)
Alkaline Phosphatase: 64 U/L (ref 39–117)
Alkaline Phosphatase: 84 U/L (ref 39–117)
BUN: 19 mg/dL (ref 6–23)
BUN: 30 mg/dL — ABNORMAL HIGH (ref 6–23)
CO2: 25 mEq/L (ref 19–32)
CO2: 28 mEq/L (ref 19–32)
Calcium: 8.3 mg/dL — ABNORMAL LOW (ref 8.4–10.5)
Calcium: 9.7 mg/dL (ref 8.4–10.5)
Chloride: 110 mEq/L (ref 96–112)
Chloride: 97 mEq/L (ref 96–112)
Creatinine, Ser: 1.36 mg/dL (ref 0.4–1.5)
Creatinine, Ser: 1.69 mg/dL — ABNORMAL HIGH (ref 0.4–1.5)
GFR calc Af Amer: 49 mL/min — ABNORMAL LOW (ref >60)
GFR calc Af Amer: >60 mL/min (ref >60)
GFR calc non Af Amer: 41 mL/min — ABNORMAL LOW (ref >60)
GFR calc non Af Amer: 52 mL/min — ABNORMAL LOW (ref >60)
Glucose, Bld: 139 mg/dL — ABNORMAL HIGH (ref 70–99)
Glucose, Bld: 151 mg/dL — ABNORMAL HIGH (ref 70–99)
Potassium: 4 mEq/L (ref 3.5–5.1)
Potassium: 4.8 mEq/L (ref 3.5–5.1)
Sodium: 130 mEq/L — ABNORMAL LOW (ref 135–145)
Sodium: 142 mEq/L (ref 135–145)
Total Bilirubin: 0.6 mg/dL (ref 0.3–1.2)
Total Bilirubin: 0.8 mg/dL (ref 0.3–1.2)
Total Protein: 6.2 g/dL (ref 6.0–8.3)
Total Protein: 6.6 g/dL (ref 6.0–8.3)

## 2010-08-07 LAB — POCT I-STAT 3, ART BLOOD GAS (G3+)
Acid-base deficit: 1 mmol/L (ref 0.0–2.0)
Bicarbonate: 25.6 mEq/L — ABNORMAL HIGH (ref 20.0–24.0)
O2 Saturation: 97 %
O2 Saturation: 97 %
Patient temperature: 97.4
Patient temperature: 98.1
TCO2: 27 mmol/L (ref 0–100)
pCO2 arterial: 42.4 mmHg (ref 35.0–45.0)
pH, Arterial: 7.386 (ref 7.350–7.450)
pO2, Arterial: 87 mmHg (ref 80.0–100.0)
pO2, Arterial: 93 mmHg (ref 80.0–100.0)

## 2010-08-07 LAB — BLOOD GAS, ARTERIAL
Acid-Base Excess: 3.1 mmol/L — ABNORMAL HIGH (ref 0.0–2.0)
Bicarbonate: 27.4 mEq/L — ABNORMAL HIGH (ref 20.0–24.0)
FIO2: 0.21 %
O2 Saturation: 97.3 %
Patient temperature: 98.6
TCO2: 28.7 mmol/L (ref 0–100)
pCO2 arterial: 43.6 mmHg (ref 35.0–45.0)
pH, Arterial: 7.414 (ref 7.350–7.450)
pO2, Arterial: 88.3 mmHg (ref 80.0–100.0)

## 2010-08-07 LAB — URINALYSIS, ROUTINE W REFLEX MICROSCOPIC
Bilirubin Urine: NEGATIVE
Glucose, UA: 250 mg/dL — AB
Ketones, ur: NEGATIVE mg/dL
Protein, ur: 30 mg/dL — AB
pH: 5.5 (ref 5.0–8.0)

## 2010-08-07 LAB — TYPE AND SCREEN: ABO/RH(D): O POS

## 2010-08-07 LAB — URINE MICROSCOPIC-ADD ON

## 2010-08-07 LAB — MRSA PCR SCREENING: MRSA by PCR: NEGATIVE

## 2010-08-07 LAB — PROTIME-INR
INR: 1 (ref 0.00–1.49)
Prothrombin Time: 13 seconds (ref 11.6–15.2)

## 2010-08-07 LAB — GLUCOSE, CAPILLARY: Glucose-Capillary: 134 mg/dL — ABNORMAL HIGH (ref 70–99)

## 2010-08-07 LAB — ABO/RH: ABO/RH(D): O POS

## 2010-08-08 LAB — GLUCOSE, CAPILLARY: Glucose-Capillary: 95 mg/dL (ref 70–99)

## 2010-09-13 NOTE — H&P (Signed)
[name], [name]             ACCOUNT NO.:  [id number]   MEDICAL RECORD NO.:  [id number]          PATIENT TYPE:  OUT   LOCATION:  CARD                         FACILITY:  [facility]   PHYSICIAN:  Ines Bloomer, M.D. DATE OF BIRTH:  1941-01-29   DATE OF ADMISSION:  10/27/2008  DATE OF DISCHARGE:  10/27/2008                              HISTORY & PHYSICAL   CHIEF COMPLAINT:  Left upper lobe nodules.   HISTORY OF PRESENT ILLNESS:  This 70 year old patient had a total  thyroidectomy 15 months ago for substernal thyroid that Dr. Gerrit Friends and I  did.  He now returns with 3 nodules in his left upper lobe that are  positive on PET with a questionable left AP window node.  He has a  history of tobacco abuse.  Pulmonary function tests pending.   FAMILY HISTORY:  He still smokes 1/2 pack of cigarettes a day , and we  have told him to quit smoking.   OTHER PAST MEDICAL PROBLEMS:  He has hypertension, hypercholesterolemia,  and BPH.   MEDICATIONS:  1. Verapamil 240 mg a day.  2. Lipitor 20 mg a day.  3. Synthroid 175 mcg a day.  4. Aspirin.   He has no allergies.   FAMILY HISTORY:  Noncontributory.   SOCIAL HISTORY:  He is married.  Works as a Naval architect.  Again smokes  about a 1/2 pack of cigarettes.  He does not drink alcohol on a regular  basis.   REVIEW OF SYSTEMS:  His weight has been stable.  He is 5 feet 6 inches,  143 pounds.  CARDIAC:  No __________ atrial fibrillation.  PULMONARY:  He has had no hemoptysis.  See history of present illness.  GI:  No  constipation.  GU:  Frequent urination, but no kidney disease.  VASCULAR:  No claudication, DVT, TIAs.  NEUROLOGICAL:  No dizziness,  headaches, blackouts, seizures.  MUSCULOSKELETAL:  No arthritis.  PSYCHIATRIC:  No depression or nervous.  HEENT:  No changes in eyesight  or hearing.  HEMATOLOGICAL:  No problems with bleeding, clotting disorders or anemia.   PHYSICAL EXAMINATION:  He is a well-developed [place] American male  in  no acute distress.  His blood pressure is 200/100, pulse 58, respirations 18, saturations  were 98%.  HEAD:  Atraumatic.  EYES:  Pupils equal, reactive to light and accommodation.  EARS:  Tympanic membranes are intact.  NOSE:  There is no septal deviation.  NECK:  Supple without thyromegaly.  There is no supraclavicular or  axillary adenopathy.  CHEST:  Clear to auscultation and percussion.  HEART:  Regular sinus rhythm, no murmur.  ABDOMEN:  Soft.  There is no hepatosplenomegaly.  Pulses are 2+.  There is no clubbing or edema.  NEUROLOGICAL:  He is oriented x3.  Sensory and motor intact.  Cranial  nerves intact.   IMPRESSION:  1. Left upper lobe lesions, questionable non-small cell lung positive      on positron emission tomography,  questionable stage IIIA lung cancer.  1. Status post thyroidectomy.  2. Hypertension.  3. Benign prostatic hypertrophy.  4. Dyslipidemia.  PLAN:  Left VATS of left upper lobectomy.      Ines Bloomer, M.D.  Electronically Signed     DPB/MEDQ  D:  11/04/2008  T:  11/04/2008  Job:  [id number]

## 2010-09-13 NOTE — Assessment & Plan Note (Signed)
OFFICE VISIT   [name], [name]  DOB:  1941-02-05                                        February 03, 2009  CHART #:  [id number]   The patient came for followup today.  His chest x-ray is improved.  He  received 2-3 cycles of chemotherapy.  His blood pressure is 134/18,  pulse 61, respirations 18, sats were 99%.  I will plan to see him back  again in 2 months with another chest x-ray, and he will complete his  chemotherapy at that time.  His incisions were well healed.   Ines Bloomer, M.D.  Electronically Signed   DPB/MEDQ  D:  02/03/2009  T:  02/04/2009  Job:  [id number]

## 2010-09-13 NOTE — Assessment & Plan Note (Signed)
[facility]                       [place] CARDIOLOGY OFFICE NOTE   NAME:[name]                    MRN:          [id number]  DATE:11/04/2008                            DOB:          [birth year]    REFERRING PHYSICIAN:  Lorin Picket A. Gerda Diss, MD   REASON FOR CONSULTATION:  Preoperative evaluation.   HISTORY OF PRESENT ILLNESS:  Mr. [name] is a pleasant 70 year old  gentleman with a long-standing history of hypertension, tobacco abuse,  and hyperlipidemia.  He reports no clear history of cardiovascular  disease or myocardial infarction.  He states that he underwent a cardiac  catheterization many years ago that was reportedly okay without  clear records for review.  At this point, he is being considered for a  left-sided VATS procedure with left upper lobectomy due to findings of  left upper lobe nodules and a positive PET scan concerning for  carcinoma.  He is actually status post a recent total thyroidectomy  (substernal thyroid) done within the last 2 years.   During his initial preoperative workup, he was noted to be significantly  hypertensive.  He does admit to some medication noncompliance.  In  addition, his electrocardiogram is abnormal with significant  anterolateral ST-T wave changes, cannot rule out ischemic changes,  although potentially repolarization abnormalities in the setting of  ventricular hypertrophy.  His electrocardiogram today in the office also  shows bradycardia with a heart rate of 52 beats per minute, initially an  ectopic low atrial bradycardia, with subsequent sinus bradycardia noted.  He denies any recent chest pain.  He has NYHA class II dyspnea on  exertion and a fairly chronic cough, nonproductive in nature.  He states  that he has been trying to cut back his cigarette use, now down to  approximately 3 pack per day for 50 years.  He has not undergone any  recent ischemic evaluation.   Allergies indicate  previous documented history of angioedema to ACE  INHIBITORS.   Present medications  1. Aspirin 81 mg p.o. daily.  2. Synthroid 175 mcg p.o. daily.  3. Verapamil ER 240 mg p.o. daily.  4. Lipitor 20 mg p.o. daily.   PAST MEDICAL HISTORY:  As outlined above.  He has a history of benign  prostatic hypertrophy as well.   SOCIAL HISTORY:  The patient is married.  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  He has a one half to one pack  per day tobacco use history for approximately 50 years.  Denies any  alcohol use.   Family history was reviewed.  States that his mother had heart  problems.  Otherwise, no obvious premature cardiovascular disease  noted.   REVIEW OF SYSTEMS:  As detailed above.  He wears dentures on the top and  bottom.  He has had occasional reflux symptoms.  No palpitations,  dizziness, or syncope.  No exertional chest pain.  No orthopnea, PND, or  lower extremity edema.  Otherwise, reviewed and negative.   PHYSICAL EXAMINATION:  VITAL SIGNS:  Blood pressure 164/90, heart rate  is in the 50s, weight 140 pounds,  height 5 feet 6 inches.  GENERAL:  This is a somewhat chronically ill-appearing male in no acute  distress.  HEENT:  Conjunctiva and lids normal.  Oropharynx clear with dentures  noted.  NECK:  Supple.  No elevated jugular venous pressure.  No loud carotid  bruits.  LUNGS:  Coarse diminished breath sounds.  Nonlabored breathing or  wheezing.  CARDIAC:  Regular rate and rhythm.  Indistinct PMI.  Soft basal systolic  murmur.  Second heart sound is preserved.  No S3 gallop.  ABDOMEN:  Soft, nontender.  Normoactive bowel sounds.  No obvious  hepatomegaly.  No bruits.  EXTREMITIES:  No significant pitting edema.  Diminished distal pulses at  1+.  SKIN:  Warm and dry without ulcerative changes.  MUSCULOSKELETAL:  No kyphosis noted.  NEUROPSYCHIATRIC:  The patient is alert and oriented x3.  Affect is  appropriate.   A recent  laboratory data shows WBC 6.2, hemoglobin 13.1, hematocrit  38.6, platelets 195, INR 1.0.  Sodium 142, potassium 4.0, chloride 110,  bicarb 28, glucose 151, BUN 19, creatinine 1.3.  Urinalysis, 250  glucose, small blood, 30 protein.   Chest x-ray done earlier today reports emphysema with left upper apex  nodule noted.   IMPRESSION AND RECOMMENDATIONS:  Preoperative evaluation in a 68-year-  old male with long-standing hypertension and hyperlipidemia as well as  tobacco abuse.  He is being considered for an elective left video-  assisted thoracoscopic surgery procedure with left upper lobectomy due  to lung nodules and a positive PET scan concerning for carcinoma.  This  is actually scheduled for Friday of this week.  At baseline, his blood  pressure has been elevated, complicated by some medication  noncompliance, and also his electrocardiogram is abnormal as already  discussed.  We will try to arrange a Lexiscan Myoview and echocardiogram  on medical therapy for tomorrow to better risk stratify the patient  prior to planned surgery.  I would like to add hydralazine 25 mg p.o.  t.i.d. and actually decrease his verapamil ER to 120 mg daily for better  blood pressure control and in light of his bradycardia/ectopic atrial  bradycardia.  We will review his testing and hopefully be able to better  understand his perioperative cardiac risk prior  to Friday.  If additional evaluation is required, surgery may need to be  deferred.  Further plans to follow.     Jonelle Sidle, MD  Electronically Signed    SGM/MedQ  DD: 11/04/2008  DT: 11/05/2008  Job #: [id number]   cc:   Lorin Picket A. Gerda Diss, MD  Ines Bloomer, M.D.

## 2010-09-13 NOTE — Consult Note (Signed)
NAMENEIL, Shaffer             ACCOUNT NO.:  [id number]   MEDICAL RECORD NO.:  [id number]          PATIENT TYPE:  INP   LOCATION:  2308                         FACILITY:  [facility]   PHYSICIAN:  Kalman Shan, MD   DATE OF BIRTH:  19-Jul-1940   DATE OF CONSULTATION:  11/06/2008  DATE OF DISCHARGE:                                 CONSULTATION   PRIMARY CARE PHYSICIAN:  Scott A. Gerda Diss, MD   CONSULT REQUESTED BY:  Ines Bloomer, MD   REASON FOR CONSULTATION:  Post-lobectomy, respiratory management in this  COPD patient.   HISTORY OF PRESENT ILLNESS:  [name] is a 70 year old smoker  with COPD baseline New York Heart Association Class II dyspnea and dry  cough.  According to Dr. Edwyna Shell, his COPD has FEV1 of 1.25 L and DLCO of  60%.  Further details of his lung functions are not known.  He underwent  today, November 06, 2008, left upper lobe lobectomy for stage IIIA non-small  cell lung cancer (3 small nodules in the same left upper lobe associated  with 1 ipsilateral and 1 hilar node).  Postoperatively, he has been  extubated and is in the PACU currently.  Given his baseline COPD,  hypertension and high risk for respiratory complications after  lobectomy, we have been consulted.   At this current time, he is stable other than some hypertension, and he  is sleepy, but arousable, and is doing well postop.   PAST MEDICAL HISTORY:  1. Benign prostatic hypertrophy.  2. Mild chronic renal insufficiency, creatinine 1.3 mg.  3. History of prior cardiac cath that was normal.  Normal bowel      myocardial perfusion stress test on November 05, 2008.  4. Difficult to control hypertension.  5. Continued tobacco abuse.  6. Hyperlipidemia.  7. COPD with FEV1 of 1.25 L, DLCO of 60% per Dr. Scheryl Darter verbal      report associated with New York Heart Association Class II dyspnea      and chronic cough.  8. Hypothyroidism.   HOME MEDICATIONS:  Aspirin, Synthroid, verapamil, and  Lipitor.   ALLERGIES:  ACE INHIBITORS.   SOCIAL HISTORY:  He lives in [place] with his wife.  He is a Ecologist.  He is a current smoker, smoked half to one pack-a-day for the  last 50 years.  He does not use alcohol.   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  He is on active surveillance.   FAMILY HISTORY:  Mother had coronary artery disease.   REVIEW OF SYSTEMS:  Currently unavailable because he is sedated and he  is postop, however, from his past medical history, I noticed that his  code status is full code.  He had underlying class II dyspnea on  exertion, mild dry cough, and occasional acid reflux symptoms.  Otherwise, review of systems is essentially negative.  His baseline is  obtained from review of the cardiologist note and Dr. Edwyna Shell.   PHYSICAL EXAMINATION:  VITAL SIGNS:  Currently T-max is 96.3, blood  pressure is 199/98 in the operating room, heart rate of 47, respiratory  rate of 14, pulse ox 99% on 4 L.  NEUROLOGIC:  He is sedated.  He arouses.  His RASS is -2.  Moves all 4  extremities.  Follows simple commands.  HEENT:  Dentition is good.  NECK:  Supple.  CARDIOVASCULAR:  S1 and S2 heard.  Pulses are well felt.  RESPIRATORY:  Even and nonlabored.  LUNGS:  Some diminished at the left base, otherwise clear.  GI:  Abdomen is soft and nontender.  Bowel sounds active.  No  organomegaly.  GENITOURINARY:  Normal external genitalia.  MUSCULOSKELETAL:  No joint deformities.  SKIN:  Intact without any rashes or lesions.   DEVICES PLACED:  1. Right IJ dual-lumen central line placed on July 9.  2. Left chest tube x 2, 50 mL bloody drainage, and small air leak.  3. Left radial line present.   RADIOLOGIC IMAGING:  There is only preop CT chest from October 14, 2008.  I  reviewed this personally, it shows spiculated left upper lobe nodules.   Postop chest x-ray is pending.   LABORATORY DATA:  Preop evaluation  shows on July 7, creatinine of 1.36  and a baseline bicarb of 28.  Baseline ABG on November 06, 2008, at 7:30 in  the morning 7.141, a pCO2 of 44, and a pO2 of 88.   Hemoglobin of 13.1, white count of 6.2, and a platelet count of 195,000,  this is on July 7.   Liver function tests on July 7 are normal with an albumin of 3.7.   ASSESSMENT AND PLAN:  This is a patient with possibly gold stage III  chronic obstructive pulmonary disease with baseline class II of exertion  dyspnea, who underwent left upper lobe lobectomy with lymph node  resection.  He is surgically stage IIB-IIIA cancer, based on T3 lesion,  (multiple nodules in the same lobe, ipsilateral hilar node, no distant  metastasis).  He is currently doing well postoperatively and is  extubated.   He remains at high complication for respiratory complications given the  fact he has had lobectomy, he is at high risk for deep vein thrombosis  and pulmonary embolism because of cancer and COPD and being  postoperative, and also having his left upper lobe removed.  He is also  at risk for pneumonia in the postoperative period due to the above  conditions.   PLAN:  1. Keep head of bed elevated greater than 30 degrees.  Add Protonix      for GI prophylaxis.  2. Start DVT prophylaxis with subcutaneous heparin, starting tonight,      this has been approved by Dr. Edwyna Shell.  3. Antibiotics x2 doses postop per Dr. Edwyna Shell, like this we will      bronchodilate in the first 24 hours.  We will wean oxygen to keep      pulse ox greater than 90%.  4. Hypertension.  He currently has fairly significant elevated blood      pressure, this needs to be controlled more aggressively.  5. COPD, management of this as stated in the postoperative management.  6. Tobacco abuse.  He needs to quit smoking.  He has been counseled,      but is failed to quit.  Hopefully, this postoperative period, will      have to quit.  If he is having withdrawal, we can always put  in      nicotine patch.  Kalman Shan, MD  Electronically Signed     MR/MEDQ  D:  11/06/2008  T:  11/07/2008  Job:  [id number]   cc:   Lorin Picket A. Gerda Diss, MD

## 2010-09-13 NOTE — Letter (Signed)
October 21, 2008   Scott A. Gerda Diss, MD  [address]  [place], [place] [address]   Re:  OLUSEGUN, GERSTENBERGER             DOB:  05-19-1940   Dear Dr. Gerda Diss:   Approximately 15 months ago, I helped Dr. Gerrit Friends to do a total  thyroidectomy on the patient.  He did well from that standpoint, but now  he returns developing 3 nodules in his left lung.  They are positive on  PET and a questionable positive lymph node.  All this points toward  possible non-small-cell lung cancer that may be multifocal.  Unfortunately, I feel the only way to really treat this would be with a  left upper lobectomy and node dissection.  Any types of needle biopsies  might just get one lesion, but not the other two lesions and only the  lobectomy will let us know about all 3 lesions as well as sampling of  the nodes since they are just very small, positive AP window node.  I  need to get pulmonary function test on the patient before considering  the surgery.  I have talked to him about this and he tentatively agrees  but is going to discuss it with his family before making a final  decision.  His blood pressure was elevated today at 200/100, pulse 53,  respirations 18, and saturations were 98%.  We will see him again when  he decides to make a decision regarding resection.  I appreciate the  opportunity of seeing the patient again.  I am sorry to see him under  these circumstances.   Sincerely,   Ines Bloomer, M.D.  Electronically Signed   DPB/MEDQ  D:  10/21/2008  T:  10/22/2008  Job:  [id number]

## 2010-09-13 NOTE — Assessment & Plan Note (Signed)
OFFICE VISIT   [name], [name]  DOB:  1941-01-23                                        December 23, 2008  CHART #:  [id number]   The patient came for followup today.  He is still having a moderate  amount of pain.  His chest x-ray looks good.  Overall, he is doing  remarkably well.  His blood pressure was 124/80, pulse 71, respirations  18, and sats were 97%.  I plan to see him back again in 2 months and we  will apparently start chemotherapy in the near future.   Ines Bloomer, M.D.  Electronically Signed   DPB/MEDQ  D:  12/23/2008  T:  12/24/2008  Job:  [id number]

## 2010-09-13 NOTE — Letter (Signed)
November 24, 2008   Scott A. Gerda Diss, MD  [address], [address]  [place], [place] [address]   Re:  [name], [name]             DOB:  1940-06-02   Dear Dr. Gerda Diss:   I saw the patient back today.  This patient underwent left upper  lobectomy for a left upper lobe lesion.  Unfortunately, he had  metastatic disease to his lymph nodes so, he was a stage IIIA non-small  cell adenocarcinoma.  He returned today with a chest tube still in place  because he had a prolonged air leak.  His air leak is closed and I  removed his chest tube.  I will see him back again tomorrow for a  followup chest x-ray.  Blood pressure is 150/70, pulse 57, respirations  18, sats were 97%.  I asked him, where is he going to be treated  extensively on chemotherapy and they are considering being treated in  [facility] by Dr. Arbutus Ped.   Sincerely,   Ines Bloomer, M.D.  Electronically Signed   DPB/MEDQ  D:  11/24/2008  T:  11/25/2008  Job:  [id number]

## 2010-09-13 NOTE — Discharge Summary (Signed)
NAMETAWAN, DEGROOTE             ACCOUNT NO.:  [id number]   MEDICAL RECORD NO.:  [id number]          PATIENT TYPE:  INP   LOCATION:  2023                         FACILITY:  [facility]   PHYSICIAN:  Ines Bloomer, M.D. DATE OF BIRTH:  [birth year]   DATE OF ADMISSION:  11/06/2008  DATE OF DISCHARGE:  11/16/2008                               DISCHARGE SUMMARY   PRIMARY ADMITTING DIAGNOSIS:  Left upper lobe lung nodule.   ADDITIONAL/DISCHARGE DIAGNOSES:  1. Invasive adenocarcinoma (T3 and T2).  2. Hypertension.  3. Hypercholesterolemia.  4. Benign prostatic hypertrophy.  5. Ongoing tobacco abuse.  6. Status post total thyroidectomy.  7. Postoperative ileus.  8. Prolonged air leak.  9. Postoperative bronchitis.   PROCEDURES PERFORMED:  Left video-assisted thoracoscopic surgery, left  main thoracotomy with left upper lobectomy and lymph node dissection.   HISTORY:  The patient is a 70 year old male who is known to Dr. Edwyna Shell  from a previous total thyroidectomy approximately 15 months ago that was  performed by Dr. Edwyna Shell and Dr. Gerrit Friends.  On recent followup, he was  noted to have three nodules in his left upper lobe.  This was confirmed  by a CT scan and a PET scan showed increased uptake in this area, as  well as increased uptake in the left AP window node.  He was reevaluated  by Dr. Edwyna Shell and it was felt that he should undergo a left VATS with  biopsies versus a left upper lobectomy at this time.  He explained all  risks, benefits and alternatives of this surgery to the patient and he  agreed to proceed.   HOSPITAL COURSE:  Mr. Meroney was admitted to [facility] on  November 06, 2008 and underwent a left upper lobectomy as described above  performed by Dr. Edwyna Shell.  Please see previously dictated operative  report for complete details of surgery.  He tolerated the procedure well  and was transferred to the SICU in stable condition.  Initially, his  postoperative course was  complicated by an ileus which was treated  conservatively with bowel rest and slow advancement of his diet.  This  ultimately did resolve.  Also, he had a mild bump in his creatinine up  to 1.69.  Again, this was treated conservatively and watched closely and  at the time of discharge, his creatinine had trended back down to  baseline.  By postop day #5, he was improving and was able to be  transported to the step-down unit.  His other postoperative complication  has been prolonged air leak.  His chest tube remained in place and was  ultimately switched to a Mini Express with evidence of persistent air  leak on exam.  He also continued to have a small left pneumothorax on  chest x-ray.  Otherwise, he made slow and steady progress.  He was  restarted on his home antihypertensive medications and his blood  pressures were well controlled at the time of discharge.  He was slowly  able to ambulate with assistance and at the time of discharge was  tolerating a regular diet.  His labs  prior to discharge showed a  hemoglobin of 10.8, hematocrit 32.3, platelets 239, white count 10.8,  sodium 134, potassium 3.8 which was replaced, BUN 22, creatinine 1.61.  It was felt that since he had continued to progress well aside from his  persistent air leak that he could be discharged home with chest tube in  place and close outpatient followup.  He was evaluated on postop day 10,  November 16, 2008 and at that time was felt to be ready for discharge home.   DISCHARGE MEDICATIONS:  1. Crestor 10 mg daily.  2. Clonidine 0.2 mg q.6 hours.  3. Spiriva 18 mcg daily.  4. Symbicort 160/4.5 2 puffs b.i.d.  5. Verapamil 240 mg daily.  6. Oxycodone 5 mg q.4-6 h. p.r.n. for pain.  7. Synthroid 0.175 mg daily.  8. Hydralazine 25 mg t.i.d.   DISCHARGE INSTRUCTIONS:  He was asked to refrain from driving, heavy  lifting or strenuous activity.  He may continue ambulating daily and  using his incentive spirometer.  He may  shower daily and clean his  incisions with soap and water.  He will continue same preoperative diet.   DISCHARGE FOLLOWUP:  He will see Dr. Edwyna Shell back in the office in 1 week  with a chest x-ray.  In the interim, he may call our office if there are  any problems or any questions arise.      Coral Ceo, P.A.      Ines Bloomer, M.D.  Electronically Signed    GC/MEDQ  D:  12/30/2008  T:  12/31/2008  Job:  [id number]   cc:   Lorin Picket A. Gerda Diss, MD

## 2010-09-13 NOTE — Op Note (Signed)
[name], [name]             ACCOUNT NO.:  [id number]   MEDICAL RECORD NO.:  [id number]          PATIENT TYPE:  AMB   LOCATION:  DAY                           FACILITY:  [facility]   PHYSICIAN:  R. Roetta Sessions, M.D. DATE OF BIRTH:  Oct 19, 1940   DATE OF PROCEDURE:  02/26/2008  DATE OF DISCHARGE:                               OPERATIVE REPORT   INDICATIONS FOR PROCEDURE:  A 70 year old gentleman with history of  multiple colonic polyps with carcinoma in situ removed back in 2005 by  me, had a followup colonoscopy in 2006 at [facility].  He has no lower GI  tract symptoms.  He is here for surveillance.  This approach has been  discussed with the patient at length.  Risks, benefits, and alternatives  have been reviewed, questions answered.  Please see the documentation in  the medical record.   PROCEDURE NOTE:  O2 saturation, blood pressure, pulse, and respirations  were monitored throughout the entire procedure.   CONSCIOUS SEDATION:  Versed 3 mg IV, Demerol 50 mg IV in divided doses.   INSTRUMENT:  Pentax video chip system.   FINDINGS:  Digital rectal exam revealed no abnormalities.  Endoscopic  Findings:  Prep was adequate.  Colon:  Colonic mucosa was surveyed from  the rectosigmoid junction to the left transverse, right colon, the  appendiceal orifice, ileocecal valve, and cecum.  These structures were  well seen and photographed for the record.  From this level, the scope  was slowly and cautiously withdrawn.  All previously mentioned mucosal  surfaces were again seen.  The patient has had multiple colonic polyps,  5-6 __________ polyps, one at the base of the cecum, one just distal to  the ileocecal valve __________ which were removed and submitted  together.  At the splenic flexure, there were 2 more polyps and in the  sigmoid, there were 2 polyps all of which were cold snared and  recovered.  The patient has scattered pancolonic diverticula and colonic  mucosa appeared normal.   Scope was pulled down the rectum.  Thorough  examination of the rectal mucosa including retroflexed view of the anal  verge demonstrated only internal hemorrhoids.  The patient tolerated the  procedure well and was reactive in endoscopy.   IMPRESSION:  Internal hemorrhoids, otherwise normal rectum.  Scattered  pancolonic diverticula, multiple colonic polyps removed via snare  polypectomy.   RECOMMENDATIONS:  1. Followup on path.  2. Diverticulosis literature provided to Mr. [place].  further      recommendations to follow.      [name], M.D.  Electronically Signed     RMR/MEDQ  D:  02/26/2008  T:  02/26/2008  Job:  [id number]   cc:   Lorin Picket A. Gerda Diss, MD  Fax: [phone]

## 2010-09-13 NOTE — Letter (Signed)
November 25, 2008   Scott A. Gerda Diss, MD  [address], [address]  [place], [place] [address]   Re:  ANTWAN, PANDYA             DOB:  03/20/1941   Dear  Dr. Gerda Diss:   I saw the patient back today.  He is doing fine, I have removed his  chest tube.  His blood pressure 147/85, pulse 81, respirations 18, sats  were 99%.  I have referred him to a Multid[facility]cology  Clinic for chemotherapy and possibly radiation.  I appreciate the  opportunity of seeing the patient.   Sincerely,   Ines Bloomer, M.D.  Electronically Signed   DPB/MEDQ  D:  11/25/2008  T:  11/25/2008  Job:  [id number]

## 2010-09-13 NOTE — Op Note (Signed)
[name], [name]             ACCOUNT NO.:  [id number]   MEDICAL RECORD NO.:  [id number]          PATIENT TYPE:  INP   LOCATION:  2308                         FACILITY:  [facility]   PHYSICIAN:  Ines Bloomer, M.D. DATE OF BIRTH:  12-09-1940   DATE OF PROCEDURE:  DATE OF DISCHARGE:  11/05/2008                               OPERATIVE REPORT   PREOPERATIVE DIAGNOSIS:  Left upper lobe masses.   POSTOPERATIVE DIAGNOSIS:  Stage IIIA non-small cell lung cancer and also  chronic obstructive pulmonary disease.   OPERATION PERFORMED:  Left video-assisted thoracic surgery, left  thoracotomy, left upper lobectomy with node dissection.   SURGEON:  Ines Bloomer, MD   FIRST ASSISTANT:  Stephanie Acre Dasovich, PA-C.   ANESTHESIA:  General.   After percutaneous insertion of all monitoring lines, the patient  underwent general anesthesia .  Dual-lumen tube was inserted.  The  patient was turned to the left lateral thoracotomy position.  This 70-  year-old patient had a long history of tobacco abuse and was found to  have three nodules in his left upper lobe; two that were continuous in  the apex and one more in the midportion of the left upper lobe.  These  were positive on PET.  There also was a questionable possibly node that  was positive on PET at the AP window.  The pulmonary function test  showed moderate-to-severe chronic obstructive pulmonary disease with a  decreased DLCO and slightly decreased FEV-1.  He was brought to the  operating room.  After he was prepped and draped in the usual sterile  manner, two trocar sites were made in the anterior and posterior  axillary line at the sixth and seventh intercostal space.  Two trocars  were inserted.  The lesion could be seen in the apex of the left upper  lobe.  We then made a third incision of the triangle auscultation  partly.  It was partially dividing the latissimus and resecting the  serratus anteriorly, we entered the fifth  intercostal space.  These ribs  were very rigid and it was very difficult to remove.  We did a partial  resection of the sixth rib at the angle using the [facility] rib shears.  Two TPAs replaced at right angle.  The patient had marked emphysema on  the left upper lobe and we did start it by dissecting out the pulmonary  artery and found a six and a five nodes and the five node was sent for  frozen section, which revealed metastatic disease.  No other lesions  could be seen.  We found the 4L and we dissected that out, then we  dissected inferiorly dissecting out some 10L nodes from around the  superior pulmonary vein, superior pulmonary vein was dissected up,  looped with a vascular tape and ligated and divided with the autosuture  2-mm 30 stapler.  We then dissected out the apical posterior branch and  looped it with a vascular stapler and then dissecting inferiorly,  dissected out some more another 10L node and dissected out a anterior  branch and looped it with a  vascular tape.  Finally, we divided the  fissure with a 30 autosuture and found the lingula, which was dissected  out and divided with the autosuture 2 mm stapler.  We then used the  autosuture 2-mm stapler divide the anterior branch and the apical  posterior branch.  There was one aberrant branch to the bronchial artery  that was ligated with 2-0 silk and divided.  The bronchus was dissected  up, stapled with a TA-30 and divided distally.  The left upper lobe was  removed.  CoSeal was applied to the staple lines, two chest tubes were  brought in through the trocar sites and tied in place with 0 silk.  We  checked for air leaks under  water and found them.  The chest was closed with 4 pericostal drilling  through the fifth rib and passed around the fifth rib, #1 Vicryl in the  muscle layer, 2-0 Vicryl subcutaneous tissue and Dermabond for the skin.  The patient was returned to the recovery room in stable condition.       Ines Bloomer, M.D.  Electronically Signed     DPB/MEDQ  D:  11/06/2008  T:  11/07/2008  Job:  [id number]

## 2010-09-13 NOTE — Op Note (Signed)
[name], [name] NO.:  [id number]   MEDICAL RECORD NO.:  [id number]          PATIENT TYPE:  INP   LOCATION:  2550                         FACILITY:  [facility]   PHYSICIAN:  Velora Heckler, MD      DATE OF BIRTH:  1941/01/09   DATE OF PROCEDURE:  07/29/2007  DATE OF DISCHARGE:                               OPERATIVE REPORT   PREOPERATIVE DIAGNOSIS:  Thyroid goiter, substernal   POSTOPERATIVE DIAGNOSIS:  Thyroid goiter, substernal   PROCEDURE:  Total thyroidectomy with resection of large substernal  goiter   SURGEON:  Velora Heckler, MD, FACS   ASSISTANT:  Ines Bloomer, M.D., FACS   ANESTHESIA:  General per Guadalupe Maple, M.D.   ESTIMATED BLOOD LOSS:  100 mL.   PREPARATION:  Betadine.   COMPLICATIONS:  None.   INDICATIONS:  The patient is a 70 year old black male from [place],  [place].  He is referred by Dr. Lilyan Punt and Dr. Karle Plumber for resection of substernal thyroid goiter.  The patient had  developed upper respiratory complaints in December 2008.  Chest x-ray  demonstrated significant tracheal deviation.  CT scan of the chest  demonstrated a very large multinodular substernal thyroid goiter  involving both the right-and-left thyroid lobes.  There was displacement  posteriorly of the trachea with compression.  The patient was seen by  Dr. Edwyna Shell.  Dr. Edwyna Shell referred the patient to my practice so that we  could perform this procedure in collaboration.  The patient now comes to  the operating room for resection of substernal goiter with tracheal  compression.   BODY OF REPORT:  Procedure was done in OR #7 at the [facility].  The patient was brought to the operating room, and placed in the supine  position on the operating room table.  Following administration of  general anesthesia, the patient is positioned; and then prepped and  draped in the usual strict aseptic fashion.  The entire chest is prepped  into the  operative field in the event that median sternotomy is  required.   After ascertaining that an adequate level of anesthesia had been  achieved, a Kocher incision is made with a #15 blade.  Dissection is  carried through subcutaneous tissues.  Large external jugular  collaterals required dissecting and ligation in continuity and division.  There appears to be a great deal of venous hypertension, probably  related to underlying compression of the superior vena cava by the  thyroid mass.  Dissection was finally carried down through the platysma.  Subplatysmal flaps were elevated from the thyroid notch to the sternal  notch.  A Mahorner self-retaining retractor is placed for exposure.  Strap muscles are incised in the midline.   Dissection is initially begun on the left side of the neck.  Thyroid is  dissected out.  The plane beneath the strap muscles is developed  allowing for a finger to track around the thyroid into the anterior  mediastinum.  The mass tracks to the level of the aortic arch or  slightly beyond.  With some difficulty the  superior pole in the left is  carefully dissected out.  The superior pole vessels are divided between  medium Ligaclips with the harmonic scalpel.  The entire pole was  mobilized.  Parathyroid tissue was identified and preserved.  Isthmus is  mobilized along its superior edge, and a large venous collateral is  ligated with 2-0 silk tie and divided with the harmonic scalpel.   Next, the right superior pole is gently dissected out; and, again, the  vasculature is divided between medium Ligaclips with the harmonic  scalpel.  Again, the right superior parathyroid gland is identified and  preserved on its vascular pedicle.  At this point, the substernal  portion of the gland is bluntly dissected with just digital pressure.  It is delivered out of the anterior mediastinum with moderate difficulty  into the cervical incision.  The midline venous tributaries are  then  ligated with 2-0 silk ties and divided.  The harmonic scalpel was used  on smaller venous tributaries allowing for mobilization of the gland.   Next, the right thyroid lobe is gently dissected away from the posterior  tissues.  Branches of the inferior thyroid artery are divided between  small Ligaclips with the harmonic scalpel.  Tubercle of Zuckerkandl is  dissected out.  Care is taken to avoid the recurrent nerve and the  parathyroid glands.  Ligament of Allyson Sabal is transected with the  electrocautery, and the gland is rolled up and onto the anterior  trachea.  It is mobilized across the midline.   Next, we turned our attention to the left side; and, again, the branches  of the inferior thyroid artery are divided between small and medium  Ligaclips.  Venous tributaries are divided between small Ligaclips.  The  ligament of Allyson Sabal is transected with electrocautery, and the remainder  of the gland is excised off the anterior trachea.  The gland is quite  large.  A suture is used to mark the right superior pole.  The entire  gland is submitted to pathology for review.   The neck is irrigated, bilaterally, as is the mediastinum.  Good  hemostasis is noted.  Fluid is evacuated.  Surgicel is placed throughout  the operative field.  Strap muscles are reapproximated in the midline  with interrupted 3-0 Vicryl sutures.  Platysma is closed with  interrupted 3-0 Vicryl sutures.  Skin is closed with a running 4-0  Monocryl subcuticular suture.  Wound is washed and dried and Benzoin and  Steri-Strips are applied.  Sterile dressings are applied.  The patient  is awakened from anesthesia, and brought to the recovery room in stable  condition.  The patient tolerated the procedure well.      Velora Heckler, MD  Electronically Signed     TMG/MEDQ  D:  07/29/2007  T:  07/29/2007  Job:  [id number]   cc:   Ines Bloomer, M.D.  Scott A. Gerda Diss, MD

## 2010-09-13 NOTE — H&P (Signed)
NAMEJOANTHONY, HAMZA             ACCOUNT NO.:  [id number]   MEDICAL RECORD NO.:  [id number]         PATIENT TYPE:  AMB   LOCATION:  DAY                           FACILITY:  [facility]   PHYSICIAN:  R. Roetta Sessions, M.D. DATE OF BIRTH:  12-Nov-1940   DATE OF ADMISSION:  DATE OF DISCHARGE:  LH                              HISTORY & PHYSICAL   CHIEF COMPLAINT:  History of carcinoma in situ and a polyp in 2005, need  surveillance now.   Mr. Kisean Rollo. Abood is a pleasant 70 year old gentleman whom I  performed colonoscopy on back in 2005 and he was found to have multiple  polyps, one removed from his rectum and was found to have carcinoma in  situ, question of microscopic invasion, was ultimately evaluated at  [facility], had subsequent EUS and colonoscopy.  He ultimately did not have  surgery.  He had a followup colonoscopy in 2006.  He was told everything  was okay.  He is not having any lower GI tract symptoms at this time.  He is here for surveillance.  He continues to be followed primarily by  Dr. Lilyan Punt.   PAST MEDICAL HISTORY:  High blood pressure and hypercholesterolemia.   PAST SURGERIES:  History of thyroid surgery in March 2009 for benign  disease as he reports.   CURRENT MEDICATIONS:  Cholesterol medications, Synthroid, and lisinopril  10 mg daily.   ALLERGIES:  No known drug allergies.   FAMILY HISTORY:  Negative for chronic GI or liver illness.   SOCIAL HISTORY:  The patient is married, has no children.  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  He smokes one-half pack of cigarettes per day.  He does not use alcohol or illicit drugs.   REVIEW OF SYSTEMS:  No reflux symptoms, odynophagia, dysphagia, or early  satiety.  No melena, no rectal bleeding.  No chest pain, no dyspnea on  exertion, no change in weight.   PHYSICAL EXAMINATION:  GENERAL:  Pleasant 70 year old gentleman resting  comfortably.  VITAL SIGNS:  Weight 144, height 5 feet 7 inches, temperature 98.7, BP  160/90, and pulse 68.  SKIN:  Warm and dry.  HEENT:  No scleral icterus.  Conjunctivae pink.  CHEST:  Lungs are clear to auscultation.  CARDIAC:  Regular rate and rhythm without murmur, gallop, or rub.  ABDOMEN:  Nondistended.  Positive bowel sounds.  Soft and nontender  without appreciable mass or organomegaly.  EXTREMITIES:  No edema.  RECTAL:  Deferred at the time of colonoscopy.   IMPRESSION:  Mr. Jalyn Rosero is a very pleasant 70 year old  gentleman with a history of multiple colonic polyps, carcinoma in situ,  rectal polyp removed back in 2005.  He has had a followup at [facility] in  2006.  It was felt ultimately not to need anything more than the  colonoscopic polypectomy that was performed.   RECOMMENDATIONS:  Surveillance colonoscopy as soon as can be arranged.  Risks, benefits, alternatives, and limitations have been reviewed.  His  questions were answered.  He is agreeable.  I will make further  recommendations once followup colonoscopy has been  performed.      Jonathon Bellows, M.D.  Electronically Signed     RMR/MEDQ  D:  01/29/2008  T:  01/30/2008  Job:  [id number]   cc:   Lorin Picket A. Gerda Diss, MD  Fax: [phone]

## 2010-09-13 NOTE — Letter (Signed)
May 05, 2009   Mohamed K. Arbutus Ped, MD  [address]  [place], [place] [address]   Re:  [name], [name]             DOB:  Feb 02, 1941   Dear Arbutus Ped,   I saw the patient in the office today.  His chest x-ray showed no  evidence of recurrence of his cancer.  Blood pressure is 147/86, pulse  66, respirations 18, sats were 99%.  I will see him back again in 2 and  6 months with a chest x-ray.  I will get a CT scan in the end of this  month.   Ines Bloomer, M.D.  Electronically Signed   DPB/MEDQ  D:  05/05/2009  T:  05/06/2009  Job:  [id number]

## 2010-09-13 NOTE — Letter (Signed)
June 11, 2007   Scott A. Gerda Diss, MD  [address], [address]  [place], [place] [address]   Re:  [name], [name]             DOB:  [birth year]   Dear Lorin Picket:   I saw Mr. Vanasten in the office today and this patient has had some  recent weight loss, and underwent a CT scan, which showed a large  substernal thyroid goiter.  He also has an elevated PCA and is being  seen by Dr. Jerre Simon for this.  He is a chronic smoker and has been  counseled to stop smoking.  He has had no fever or chills, excessive  sputum.  He has had no episodes of nervousness.  The CT scan shows a  large substernal thyroid goiter, greater on the left than on the right  with multiple small nodules.   PAST MEDICAL HISTORY:  Significant for hypertension and  hypercholesterolemia.   FAMILY HISTORY:  Noncontributory.   SOCIAL HISTORY:  He is married.  Works as a Naval architect.  Smokes a half  pack of cigarettes a day.  Does not drink alcohol on a regular basis.   REVIEW OF SYSTEMS:  He has had weight loss.  He is 5 feet 6 inches, 143  pounds.  CARDIAC:  No angina or atrial fibrillation.  PULMONARY:  No hemoptysis, asthma or wheezing.  GI:  He has constipation.  GU:  Frequent urination.  As mentioned, is seen by Dr. Jerre Simon for this.  VASCULAR:  No claudication, DVT, TIAs.  NEUROLOGICAL:  No headaches, blackouts, seizures or dizziness.  MUSCULOSKELETAL:  No arthritis or joint pain.  No psychiatric illnesses.  No change in his eyesight or hearing.  HEMATOLOGICAL:  No problems with bleeding, clotting disorders or anemia.   PHYSICAL EXAMINATION:  He is a thin, African American male in no acute  distress.  His blood pressure is 140/80, pulse 66, respirations 18.  Saturations were 98%. Age is 80.  Height is 5 feet 6 inches.  Head,  eyes, ears, nose and throat are unremarkable.  Neck:  There is no  thyromegaly.  I do not palpate an enlarged thyroid.  No carotid bruits,  no jugular venous distention.  No axillary or  supraclavicular  adenopathy.  Chest:  Distant breath sounds.  Heart:  Regular sinus  rhythm.  No murmurs.  Abdomen:  Soft.  There is no hepatosplenomegaly .  Pulses are 2+.  There is no clubbing or edema.  Neurological:  He is  oriented x3.  Sensory and motor intact.  Cranial nerves are intact.  Skin:  Without lesions.   I feel that Mr. Kras needs to have resection of this substernal  goiter.  There is some tracheal compression on CT scan and with his  emphysema and chronic smoking, this will only get worse.  As I  mentioned, I have counseled him to stop smoking.  I will refer him to  Dr. Darnell Level for his evaluation.  I did explain to him that Dr.  Gerrit Friends do these together and more than likely, we would be able to  remove this with a neck incision, but I would be prepared to do a median  sternotomy if we need that to remove the lesion.   I appreciate the opportunity of seeing Mr. Calip.   Ines Bloomer, M.D.  Electronically Signed   DPB/MEDQ  D:  06/11/2007  T:  06/12/2007  Job:  [id number]   cc:  Velora Heckler, MD

## 2010-09-16 NOTE — Op Note (Signed)
NAME:  [name], MCPARTLIN                       ACCOUNT NO.:  [id number]   MEDICAL RECORD NO.:  [id number]                   PATIENT TYPE:  AMB   LOCATION:  DAY                                  FACILITY:  [facility]   PHYSICIAN:  R. Roetta Sessions, M.D.              DATE OF BIRTH:  [birth year]   DATE OF PROCEDURE:  01/01/2004  DATE OF DISCHARGE:                                 OPERATIVE REPORT   PROCEDURE:  Colonoscopy, snare polypectomy.   INDICATIONS FOR PROCEDURE:  The patient is a 70 year old gentleman referred  for screening colonoscopy. He is devoid of any lower GI tract symptoms. He  states he had a flexible sigmoidoscopy many years ago. He has never had a  full colonoscopy. There is no family history of colorectal neoplasia.  Colonoscopy is now being done as a standard screening maneuver. This  approach has been discussed with the patient at length. Potential risks,  benefits, and alternatives have been reviewed and questions answered. Please  see documentation in the medical record for more information.   PROCEDURE NOTE:  O2 saturation, blood pressure, pulse, and respirations  monitored throughout the entirety of the procedure. Conscious sedation with  Versed 3 mg IV and Demerol 75 mg IV in divided doses.   INSTRUMENT:  Olympus video chip system.   FINDINGS:  Digital rectal examination revealed no abnormalities.   ENDOSCOPIC FINDINGS:  Prep was good.   Rectum:  Examination of the rectal mucosa including retroflexed view of anal  verge revealed a 2-cm polyp on a broad stalk at 15 cm from the anal verge.  The remainder of the rectal mucosa appeared normal.   Colon:  Colonic mucosa was surveyed from the rectosigmoid junction through  the left, transverse, and right colon to area of the appendiceal orifice,  ileocecal valve, and cecum. These structures were well seen and photographed  for the record. Olympus video scope was slowly withdrawn, and all previously  mentioned mucosal  surfaces were again seen. The following abnormalities were  noted:  There was a 2-cm pedunculated polyp on a broad stalk at 15 cm from  the anal verge in the rectum as stated above. In the colon, there were pan  colonic diverticula. There was a 5-mm pedunculated polyp opposite the  ileocecal valve as well as a second 3-mm polyp at the ileocecal valve and a  dimunitive polyp on the ileocecal valve. The 2 pedunculated polyps were  resected with snare cautery and recovered through the scope. The dimunitive  polyp was destroy with the tip of the snare cautery unit. Remainder of  colonic mucosa appeared normal. The large rectal polyp was removed in  piecemeal fashion with snare cautery. The polyp was felt to have been  completely resected. Multiple pieces were recovered. The patient tolerated  the procedure well and was reactive to endoscopy.   IMPRESSION:  1.  Pedunculated rectal polyp resected with snare cautery as described  above. The remainder of the rectal mucosa appeared normal.  2.  Polyps in the right colon, destroyed and/or removed with snare cautery      unit.  3.  Pan colonic diverticula.   RECOMMENDATIONS:  1.  No aspirin or arthritis medications for the next 10 days.  2.  Diverticulosis literature provided to Mr. Awe.  3.  Follow up on pathology.  4.  Further recommendations to follow.      ___________________________________________                                            Jonathon Bellows, M.D.   RMR/MEDQ  D:  01/01/2004  T:  01/02/2004  Job:  [id number]   cc:   Gerda Diss, M.D.

## 2010-09-16 NOTE — Discharge Summary (Signed)
Jesse, Shaffer NO.:  [id number]   MEDICAL RECORD NO.:  [id number]          PATIENT TYPE:  INP   LOCATION:  5123                         FACILITY:  [facility]   PHYSICIAN:  Velora Heckler, MD      DATE OF BIRTH:  08/12/1940   DATE OF ADMISSION:  07/29/2007  DATE OF DISCHARGE:  07/30/2007                               DISCHARGE SUMMARY   REASON FOR ADMISSION:  Substernal thyroid goiter with tracheal  compression.   HISTORY OF PRESENT ILLNESS:  The patient is a 70 year old black male  from [place], [place].  He was referred by Dr. Lilyan Punt and  Dr. Karle Plumber for resection of substernal thyroid goiter.  This was  found on chest x-ray at [facility].  The patient had  significant tracheal deviation.  The patient is now brought to Surgery  for resection.   HOSPITAL COURSE:  The patient was admitted on July 29, 2007.  The  patient was taken to the operating room in conjunction with Dr. Karle Plumber from Thoracic Surgery.  The patient underwent total thyroidectomy  with resection of a large substernal goiter.  Postoperative course was  straightforward.  The patient's calcium level stabilized in the normal  range.  He tolerated a diet.  He was prepared for discharge home on the  first postoperative day.   DISCHARGE PLANNING:  The patient is discharged home on July 30, 2007 in  good condition, tolerating a regular diet, and ambulating independently.  The patient will be seen back in my office at [facility] surgery  in 2-3 weeks.   DISCHARGE MEDICATIONS:  Include Vicodin for pain, Synthroid 125 mcg  daily, and Tums tablets 2 tablets 3 times daily.   FINAL DIAGNOSIS:  Substernal thyroid goiter.   CONDITION AT DISCHARGE:  Good.      Velora Heckler, MD  Electronically Signed     TMG/MEDQ  D:  09/18/2007  T:  09/19/2007  Job:  [id number]   cc:   Lorin Picket A. Gerda Diss, MD  Ines Bloomer, M.D.

## 2010-09-16 NOTE — H&P (Signed)
NAMEPHINNEAS, SHAKOOR             ACCOUNT NO.:  [id number]   MEDICAL RECORD NO.:  [id number]          PATIENT TYPE:   LOCATION:                                FACILITY:  [facility]   PHYSICIAN:  R. Roetta Sessions, M.D.      DATE OF BIRTH:   DATE OF ADMISSION:  12/16/2004  DATE OF DISCHARGE:  LH                                HISTORY & PHYSICAL   INDICATIONS FOR PROCEDURE:  Patient is a 70 year old gentleman who underwent  a colonoscopy by me last year for screening purposes.  On January 01, 2004,  a pedunculated rectal polyp was found and resected.  There was a polyp in  the right colon as well, and he had pan-colonic diverticula.  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  Ultimately, we went back and did a  Flexible sigmoidoscopy.  I saw nothing but scar.  It was biopsied.  Biopsies  were negative.  This area was ablated with the tip of the snare.  Subsequently, he was referred over to Dr. Leone Haven, Professor of  Surgery and Chief of Surgical Oncology at [facility].  Mr. Kelby Fam underwent  further evaluation, including endoscopic ultrasound and pan CT.  Ultimately,  (per Mr. Flippin's verbal report, as I did not get the last notes from Dr.  Joselyn Glassman), it was felt that this lesion had been resected totally and no  further intervention was warranted.  Mr. Kelby Fam has done very well.  He has  no bowel symptoms and has not had any interim health problems since being  seen by me nearly a year ago.  He continues to be followed primarily by Dr.  Lilyan Punt.   PAST MEDICAL HISTORY:  1.  Hypertension.  2.  Hyperlipidemia.   CURRENT MEDICATIONS:  1.  Aspirin 325 mg daily.  2.  HCTZ 25 mg 1/2 tablet daily.  3.  Lisinopril 10 mg daily.  4.  Lipitor 20 mg daily.   ALLERGIES:  No known drug allergies.   FAMILY/SOCIAL HISTORY:  Noncontributory.   REVIEW OF SYSTEMS:  No odynophagia, dysphagia, early satiety,  reflux  symptoms, nausea or vomiting.  No weight loss.  No chest pain, dyspnea on  exertion.  No fevers or chills.   PHYSICAL EXAMINATION:  VITAL SIGNS:  Weight 148.  Height 5 foot 7.  Temp  98.2, BP 146/80, pulse 64.  GENERAL:  This is a pleasant 70 year old gentleman who looks well.  SKIN:  Warm and dry.  CHEST:  Lungs are clear to auscultation.  CARDIAC:  Regular rate and rhythm without murmur, rub or gallop.  ABDOMEN:  Nondistended.  Positive bowel sounds.  Soft and nontender without  appreciable mass or organomegaly.  EXTREMITIES:  No edema.  RECTAL:  Deferred at the time of colonoscopy.   IMPRESSION:  Mr. Trysten Bernard is a 70 year old gentleman who underwent a  screening colonoscopy one year ago and was found to have multiple colonic  polyps, a rectal polyp, and then carcinoma in situ.  It was a very close  call  as to whether or not this lesion was beyond carcinoma in situ.  I am  glad to see that he followed through with his evaluation at [facility] and looks  like we are in the clear; however, given the close-call nature of this  lesion, I feel it would be prudent to go ahead and bring him back one year  out, since technically he did have a carcinoma, although it may well have  just been carcinoma in situ, although borderline invasion.  Seemed equally  plausible that we would go ahead and check his colon one year out, which  would be sometime in the near future.  We explained this approach to Mr.  Flippin.  The potential risks, benefits and alternatives have been reviewed  and questions answered.  He is agreeable.  We will plan to perform  colonoscopy in the next couple of weeks.  Will make further recommendations  at that time.      Jonathon Bellows, M.D.  Electronically Signed     RMR/MEDQ  D:  11/24/2004  T:  11/24/2004  Job:  [id number]   cc:   Lorin Picket A. Gerda Diss, MD  [address], [address]  [place]  [place] [address]  Fax: [phone]

## 2010-09-16 NOTE — Op Note (Signed)
[name], DASARO             ACCOUNT NO.:  [id number]   MEDICAL RECORD NO.:  [id number]          PATIENT TYPE:  AMB   LOCATION:  DAY                           FACILITY:  [facility]   PHYSICIAN:  R. Roetta Sessions, M.D. DATE OF BIRTH:  03-Oct-1940   DATE OF PROCEDURE:  01/26/2004  DATE OF DISCHARGE:                                 OPERATIVE REPORT   PROCEDURE:  Flexible sigmoidoscopy with biopsy and thermal ablation.   INDICATIONS:  The patient is a 70 year old gentleman who underwent  colonoscopy on January 01, 2004.  He was found to have rectal polyp at 15  cm that was resected in piecemeal fashion totally.  Pathologists feel that  there was adenocarcinoma in the polyp; at least intramucosal carcinoma and  possibly microinvasion involving the stalk.  However, piecemeal nature of  the procedure polypectomy made the orientation difficult.  He has done well  since the procedure.  Sigmoidoscopy is now being done to look back at the  polyp site.  This procedure has been discussed with the patient previously.  The potential risks, benefits and alternatives have been reviewed.  Please  see my handwritten, updated H&P.   DESCRIPTION OF PROCEDURE:  Oxygen saturation, blood pressure, pulse and  respiration were monitored throughout the entire procedure.  No conscious  sedation was given.  Instrument is the Olympus video chip system.   FINDINGS:  Digital rectal exam initially revealed no abnormalities.   ENDOSCOPIC FINDINGS:  Prep was good.  Exam was carried out to 25 cm.  At 15  cm on a fold again the polyp site was identified.  There appeared to be a  well healing granulated polypectomy site.  I did not see any residual polyp  tissue.  Please see photos.  The center of this area was biopsied via  histologic study and finally using the tip of the snare cautery unit, this  area was ablated additionally.  The patient tolerated the procedure well and  was subsequently discharged.   Compression of previous polypectomy site identified at 15 cm.  Polyp  appeared to be resected completely.  The base of the polypectomy scar  biopsied and ablated additionally.   RECOMMENDATIONS:  Will check his CEA today.  Will review the current  pathology and the prior pathology carefully with the pathologist and make  further recommendations in the very near future.  Rectum:  Examination of the rectal mucosa including retroflexion in the anal  verge revealed only internal hemorrhoids.  Colon:  Colonic mucosa was surveyed from the rectosigmoid junction to the  left, transverse,  right colon to the area of the appendiceal orifice,  ileocecal valve and cecum. The ileocecal valve as somewhat patulous but  otherwise appeared normal.  The terminal ileum was easily intubated at 10 cm  and this segment of the GI tract appeared normal.  The colonic mucosa all  the way to the cecum appeared normal except for a 4 mm sessile polyp at 40  cm.  From the level of the cecum and ileocecal valve, the scope was slowly  withdrawn.  All previously mentioned mucosal surfaces  were again seen and no  other abnormalities were observed.  The polyp at 40 cm was cold  biopsied/removed.  The patient tolerated the procedure well and was reactive  after endoscopy.   IMPRESSION:   RECOMMENDATIONS:  Further recommendations to follow.     Otelia Sergeant   RMR/MEDQ  D:  01/26/2004  T:  01/26/2004  Job:  [id number]   cc:   Lorin Picket A. Gerda Diss, M.D.  [address], [address]  [place]  [place] [address]  Fax: [phone]

## 2010-12-28 IMAGING — CR DG CHEST 2V
3 series · 3 of 3 positions shown · non-contrast
Comparison: CT chest 05/03/2007 and two-view chest x-ray
05/01/2007.

CLINICAL DATA: Cough.

CHEST - 2 VIEW 10/09/2008:

[view not recorded (1 of 3)]
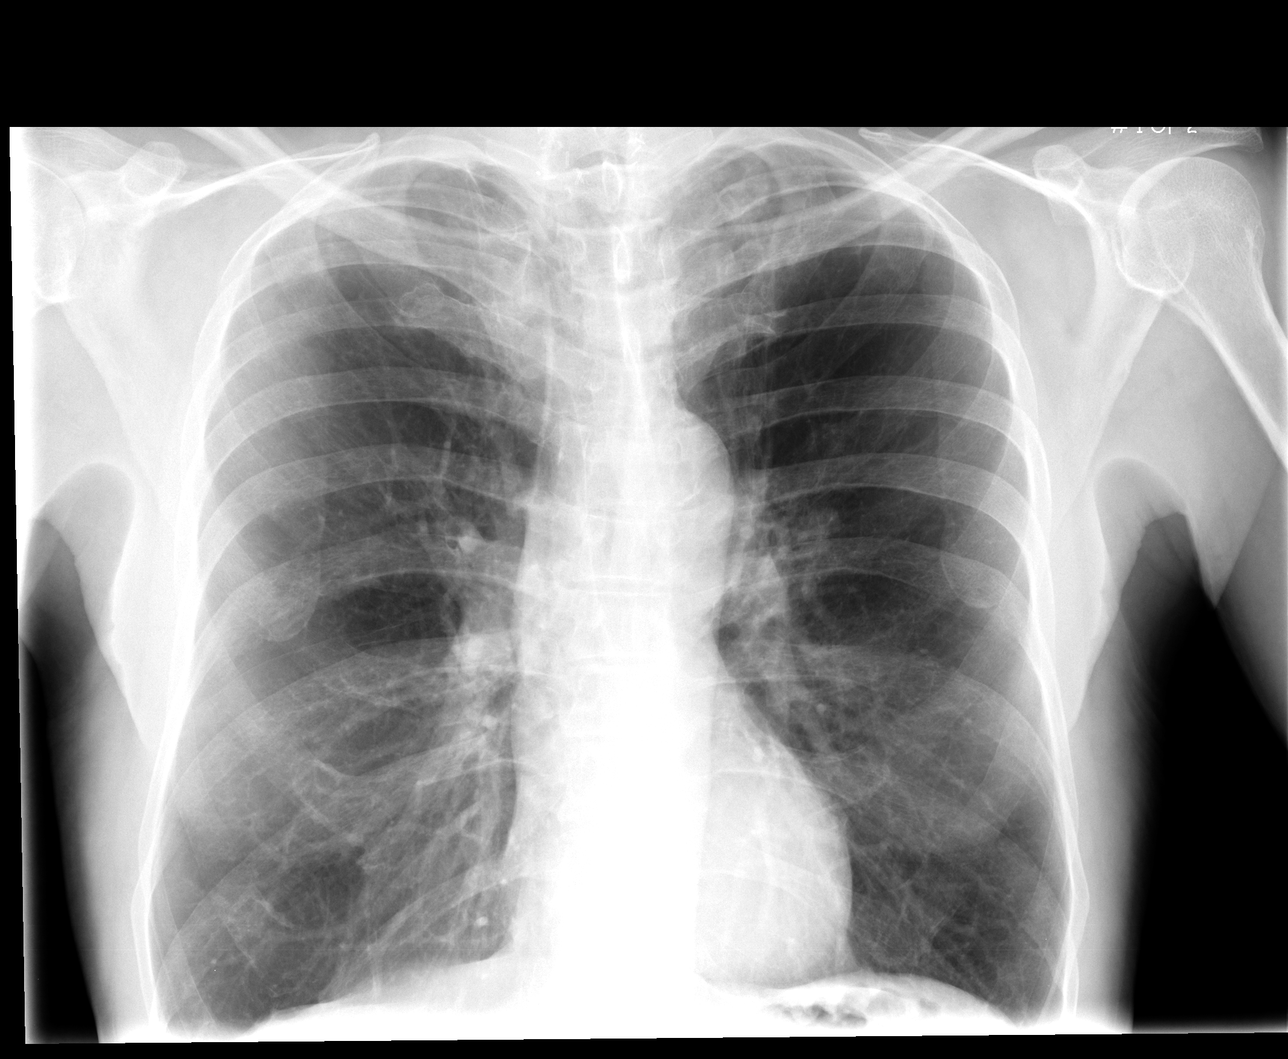

[view not recorded (2 of 3)]
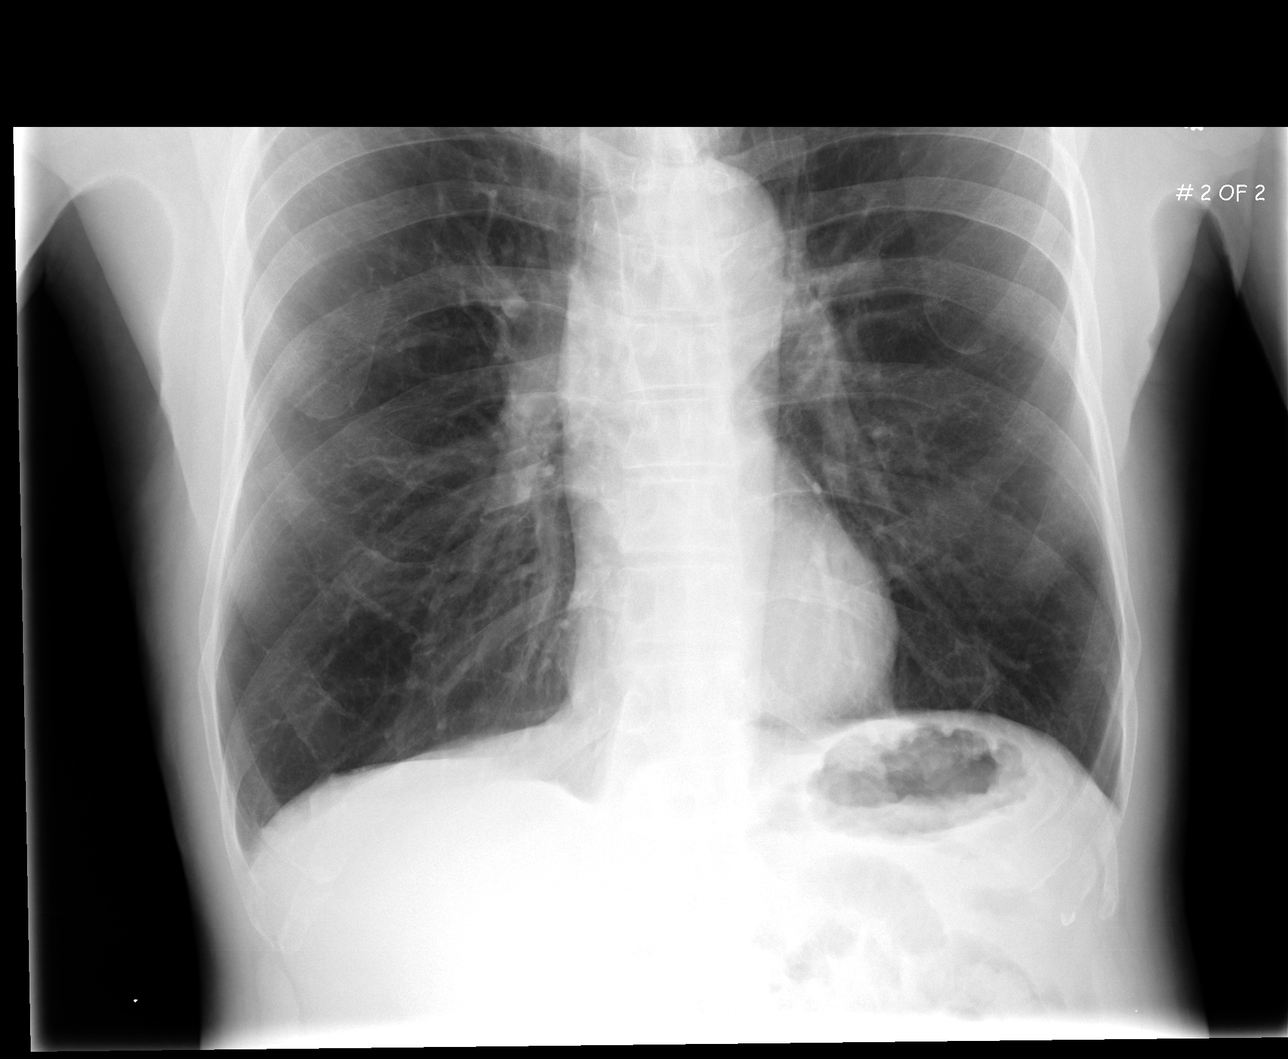

[view not recorded (3 of 3)]
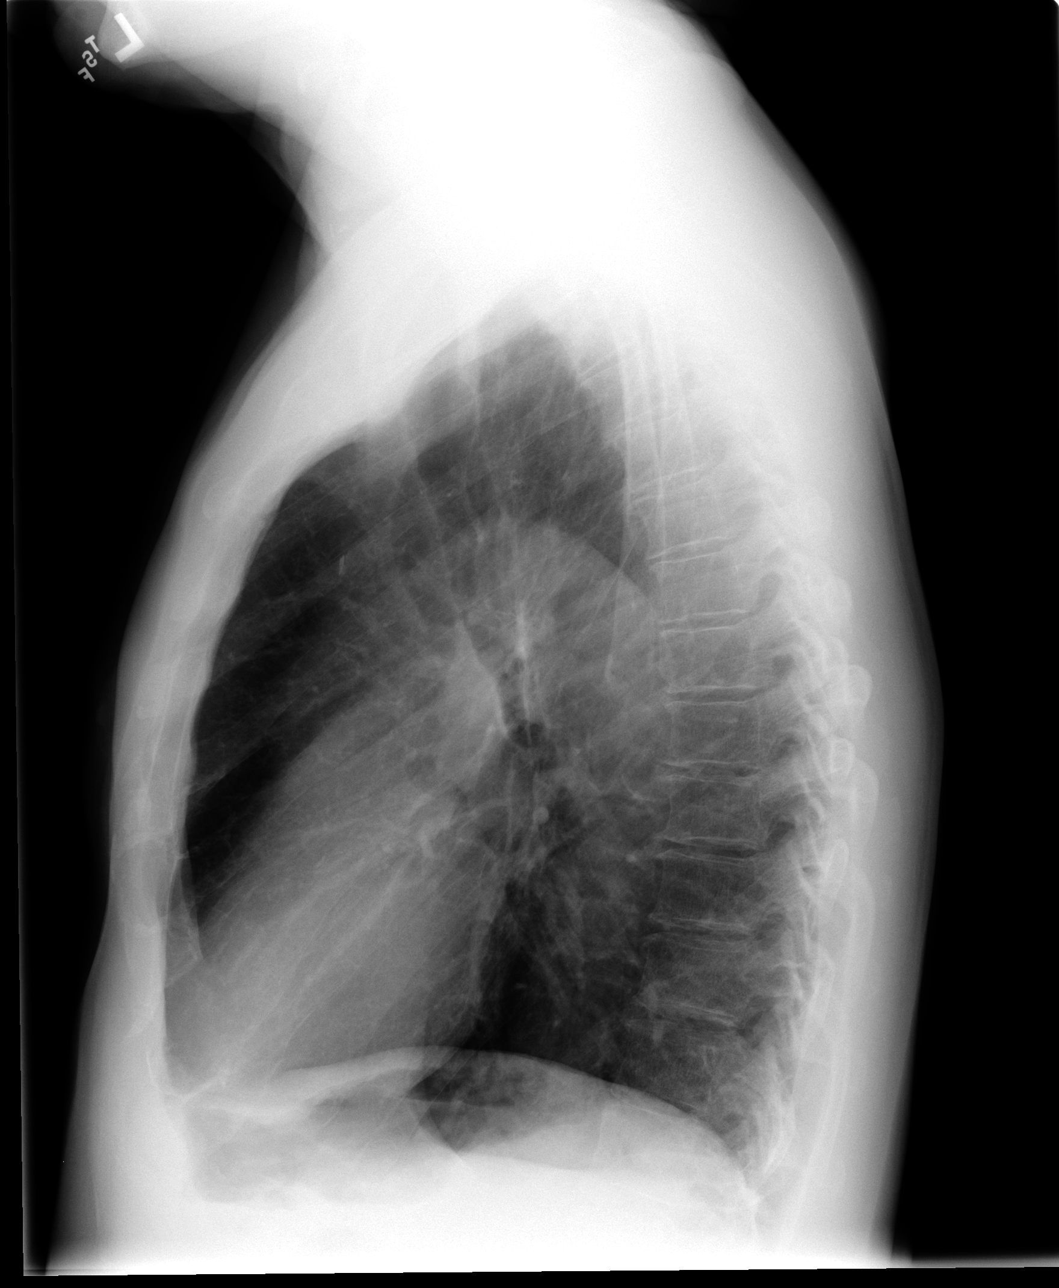

[3 of 3 positions shown; findings below may reference images not displayed]

FINDINGS: Interval development of a nodule centrally in the left
upper lobe since the prior chest x-ray.  The left apical nodule
identified on the prior chest x-ray is not visible on the chest x-
ray.  No other pulmonary parenchymal nodules are identified.
Hyperinflation and emphysematous changes throughout both lungs,
unchanged.  Cardiomediastinal silhouette unremarkable for age and
unchanged.  No pleural effusions.  Minimal degenerative changes in
the mid thoracic spine.
IMPRESSION: 1.  Nodule centrally in the left upper lobe, probably increased in
size since the CT 05/03/2007.  Repeat CT chest would be suggested
in further evaluation.
2.  COPD/emphysema.  No acute cardiopulmonary disease.

## 2011-01-08 IMAGING — PT NM PET TUM IMG INITIAL (PI) SKULL BASE T - THIGH
1 of 7 series · 1 of 25 positions shown · non-contrast
Comparison: CT thorax 10/14/2008

CLINICAL DATA: Initial treatment strategy for bronchogenic
carcinoma.

NUCLEAR MEDICINE PET SKULL BASE TO THIGH
Fasting Blood Glucose:  95
TECHNIQUE: 17.9 mCi F-18 FDG was injected intravenously via the
right antecubital fossa.  Full-ring PET imaging was performed from
the skull base through the mid-thighs 77  minutes after injection.
CT data was obtained and used for attenuation correction and
anatomic localization only.  (This was not acquired as a diagnostic
CT examination.)

[Series 2: ct images · axial · 3.8mm · 0.98mm/px · 1 of 223 slices shown]
[im 223/223  brain]
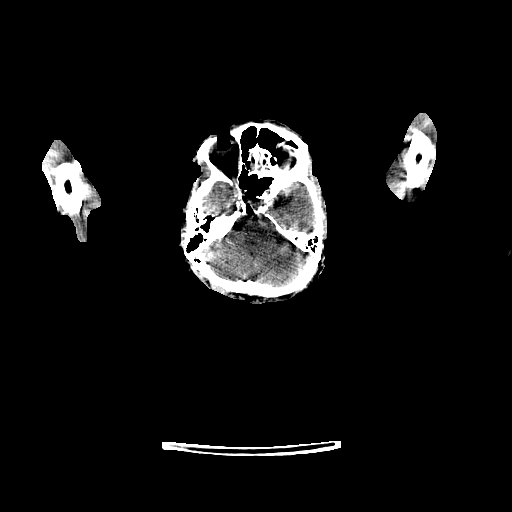

[1 of 25 positions shown; findings below may reference images not displayed]

FINDINGS: Neck: No hypermetabolic nodes in the neck.

Chest: Within the left lung apex there is a multilobular nodule
with hypermetabolic activity which extends in a vertical
orientation (1.7 x 1.3 x 3.0 cm) with SUV max = 5.4 (images 41 to
52).  Inferior to the above described nodule, there is a small
pulmonary nodule which measures 6 mm (image 58) just superior to
the left hilum which demonstrates hypermetabolic activity with S U
V max equal  1.6 which is significant for its small size.  There is
a third hypermetabolic nodule adjacent to the left hilum which
measures 1.5 cm (image 74)  with SUV max =

There is a small hypermetabolic AP window node which measures 7 mm
short axis (image 73) with SUV max = 3.4.  No additional
hypermetabolic mediastinal nodes are identified.  No hypermetabolic
nodes or nodules in the right hemithorax.

Abdomen / Pelvis: No abnormal hypermetabolic activity within the
solid organs.  No evidence of abdominal or pelvic hypermetabolic
nodes.

Skeleton: No focal hypermetabolic activity to suggest skeletal
metastasis.
IMPRESSION: 1.  Elongated hypermetabolic nodule in the left upper lobe
consistent with primary bronchogenic carcinoma.
2.  Left perihilar 1.5 cm hypermetabolic nodule consistent with
likely synchronous primary bronchogenic carcinoma.
3.  Small 6 mm left upper lobe pulmonary nodule inferior to the
larger linear apical nodule is concerning for a satellite
metastasis.

4.  Single small hypermetabolic AP window node is concerning for
ipsilateral mediastinal metastasis.

## 2011-01-23 LAB — CBC
HCT: 39
MCHC: 33
MCV: 91.4
Platelets: 241
RDW: 14.2

## 2011-01-23 LAB — DIFFERENTIAL
Basophils Absolute: 0
Basophils Relative: 0
Eosinophils Absolute: 0.3
Eosinophils Relative: 5
Monocytes Absolute: 0.8

## 2011-01-23 LAB — URINE MICROSCOPIC-ADD ON

## 2011-01-23 LAB — BASIC METABOLIC PANEL
BUN: 21
CO2: 31
Chloride: 104
GFR calc non Af Amer: 49 — ABNORMAL LOW
Glucose, Bld: 77
Potassium: 4.4

## 2011-01-23 LAB — URINALYSIS, ROUTINE W REFLEX MICROSCOPIC
Bilirubin Urine: NEGATIVE
Ketones, ur: NEGATIVE
Specific Gravity, Urine: 1.017
Urobilinogen, UA: 0.2

## 2011-01-23 LAB — CALCIUM: Calcium: 8.9

## 2011-01-23 LAB — TSH: TSH: 0.673

## 2011-01-23 LAB — PROTIME-INR: Prothrombin Time: 13.3

## 2011-01-26 ENCOUNTER — Encounter: Payer: Self-pay | Admitting: Internal Medicine

## 2011-02-21 ENCOUNTER — Encounter: Payer: Self-pay | Admitting: Cardiology

## 2019-11-21 ENCOUNTER — Telehealth (HOSPITAL_COMMUNITY): Payer: Self-pay | Admitting: *Deleted

## 2019-11-27 NOTE — Telephone Encounter (Signed)
I received confirmation the day I created the message. In routing history there's no record of it, so I routed it to [place] again.
